# Patient Record
Sex: Female | Born: 1995 | Race: Black or African American | Hispanic: No | Marital: Single | Smoking: Never smoker
Health system: Southern US, Community
[De-identification: ages and names within clinical notes are randomized; demographics above are authoritative.]

## PROBLEM LIST (undated history)

## (undated) DIAGNOSIS — D649 Anemia, unspecified: Secondary | ICD-10-CM

## (undated) HISTORY — PX: NO PAST SURGERIES: SHX2092

---
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
## 2016-01-30 ENCOUNTER — Emergency Department (INDEPENDENT_AMBULATORY_CARE_PROVIDER_SITE_OTHER)
Admission: EM | Admit: 2016-01-30 | Discharge: 2016-01-30 | Disposition: A | Discharge status: Home / Self Care | Payer: Medicaid Other | Source: Home / Self Care | Attending: Family Medicine | Admitting: Family Medicine

## 2016-01-30 ENCOUNTER — Encounter (HOSPITAL_COMMUNITY): Payer: Self-pay | Admitting: Emergency Medicine

## 2016-01-30 DIAGNOSIS — K529 Noninfective gastroenteritis and colitis, unspecified: Secondary | ICD-10-CM | POA: Diagnosis not present

## 2016-01-30 LAB — POCT URINALYSIS DIP (DEVICE)
BILIRUBIN URINE: NEGATIVE
GLUCOSE, UA: NEGATIVE mg/dL
KETONES UR: NEGATIVE mg/dL
Nitrite: NEGATIVE
PROTEIN: NEGATIVE mg/dL
Urobilinogen, UA: 0.2 mg/dL (ref 0.0–1.0)
pH: 6 (ref 5.0–8.0)

## 2016-01-30 LAB — POCT PREGNANCY, URINE: PREG TEST UR: NEGATIVE

## 2016-01-30 MED ORDER — ONDANSETRON HCL 4 MG PO TABS: 4.0000 mg | ORAL_TABLET | Freq: Four times a day (QID) | ORAL | Status: DC

## 2016-01-30 NOTE — ED Provider Notes (Signed)
CSN: [id number]     Arrival date & time 01/30/16  1317 History   First MD Initiated Contact with Patient 01/30/16 1457     Chief Complaint  Patient presents with  . Abdominal Pain   (Consider location/radiation/quality/duration/timing/severity/associated sxs/prior Treatment) HPI History obtained from patient:   LOCATION:abdomen SEVERITY: DURATION:11 hours  CONTEXT:sudden onset 0400 of GI symptoms.  QUALITY: MODIFYING FACTORS: ASSOCIATED SYMPTOMS: TIMING:constant OCCUPATION:  History reviewed. No pertinent past medical history. History reviewed. No pertinent past surgical history. No family history on file. Social History  Substance Use Topics  . Smoking status: Never Smoker   . Smokeless tobacco: None  . Alcohol Use: No   OB History    No data available     Review of Systems ROS +'ve ABDOMINAL PAIN  Denies: HEADACHE, NAUSEA,  CHEST PAIN, CONGESTION, DYSURIA, SHORTNESS OF BREATH  Allergies  Review of patient's allergies indicates no known allergies.  Home Medications   Prior to Admission medications   Medication Sig Start Date End Date Taking? Authorizing Provider  ibuprofen (ADVIL,MOTRIN) 600 MG tablet Take 1 tablet (600 mg total) by mouth every 6 (six) hours as needed for pain. 05/12/13   Kristen Cardinal, NP   Meds Ordered and Administered this Visit  Medications - No data to display  BP 131/84 mmHg  Pulse 81  Temp(Src) 98.4 F (36.9 C) (Oral)  SpO2 100%  LMP 01/19/2016 No data found.   Physical Exam  Constitutional: She is oriented to person, place, and time. She appears well-developed and well-nourished. No distress.  HENT:  Head: Normocephalic and atraumatic.  Pulmonary/Chest: Effort normal and breath sounds normal.  Abdominal: Soft. Bowel sounds are normal. She exhibits no distension. There is no tenderness. There is no rebound and no guarding.  Musculoskeletal: Normal range of motion.  Neurological: She is alert and oriented to person, place, and  time.  Skin: Skin is warm and dry.  Psychiatric: She has a normal mood and affect. Her behavior is normal.    ED Course  Procedures (including critical care time)  Labs Review Labs Reviewed  POCT URINALYSIS DIP (DEVICE) - Abnormal; Notable for the following:    Hgb urine dipstick MODERATE (*)    Leukocytes, UA TRACE (*)    All other components within normal limits  POCT PREGNANCY, URINE    Imaging Review No results found.   Visual Acuity Review  Right Eye Distance:   Left Eye Distance:   Bilateral Distance:    Right Eye Near:   Left Eye Near:    Bilateral Near:         MDM   1. Gastroenteritis     Patient is advised to continue home symptomatic treatment. Prescription for zofran  sent pharmacy patient has indicated. Patient is advised that if there are new or worsening symptoms or attend the emergency department, or contact primary care provider. Instructions of care provided discharged home in stable condition. Return to work/school note provided.  THIS NOTE WAS GENERATED USING A VOICE RECOGNITION SOFTWARE PROGRAM. ALL REASONABLE EFFORTS  WERE MADE TO PROOFREAD THIS DOCUMENT FOR ACCURACY.     Konrad Felix, [place] 01/30/16 1525

## 2016-01-30 NOTE — Discharge Instructions (Signed)

## 2016-01-30 NOTE — ED Notes (Signed)
C/o abd pain onset 0400 today associated w/emesis and diarrhea Denies fever, chills, vag d/c, urinary sx A&O x4... No acute distress.

## 2016-08-11 ENCOUNTER — Encounter: Payer: Self-pay | Admitting: Obstetrics and Gynecology

## 2018-06-25 ENCOUNTER — Emergency Department (HOSPITAL_COMMUNITY)
Admission: EM | Admit: 2018-06-25 | Discharge: 2018-06-25 | Disposition: A | Discharge status: Home / Self Care | Payer: No Typology Code available for payment source | Attending: Emergency Medicine | Admitting: Emergency Medicine

## 2018-06-25 ENCOUNTER — Other Ambulatory Visit: Payer: Self-pay

## 2018-06-25 ENCOUNTER — Encounter (HOSPITAL_COMMUNITY): Payer: Self-pay | Admitting: Emergency Medicine

## 2018-06-25 DIAGNOSIS — Y999 Unspecified external cause status: Secondary | ICD-10-CM | POA: Diagnosis not present

## 2018-06-25 DIAGNOSIS — M542 Cervicalgia: Secondary | ICD-10-CM | POA: Insufficient documentation

## 2018-06-25 DIAGNOSIS — Y939 Activity, unspecified: Secondary | ICD-10-CM | POA: Diagnosis not present

## 2018-06-25 DIAGNOSIS — Y929 Unspecified place or not applicable: Secondary | ICD-10-CM | POA: Diagnosis not present

## 2018-06-25 DIAGNOSIS — Z79899 Other long term (current) drug therapy: Secondary | ICD-10-CM | POA: Insufficient documentation

## 2018-06-25 MED ORDER — CYCLOBENZAPRINE HCL 10 MG PO TABS: 10.0000 mg | ORAL_TABLET | Freq: Two times a day (BID) | ORAL | 0 refills | Status: DC | PRN

## 2018-06-25 MED ORDER — MELOXICAM 7.5 MG PO TABS: 7.5000 mg | ORAL_TABLET | Freq: Every day | ORAL | 0 refills | Status: DC
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
## 2018-12-27 NOTE — L&D Delivery Note (Signed)
Delivery Note  First Stage: Labor onset: 0200 Induction: Oxytocin Analgesia /Anesthesia intrapartum: IV meds and Epidural SROM at 0752 for meconium  Second Stage: Complete dilation at 0822 Onset of pushing at 0833 FHR second stage 135 bpm w/ moderate variability, recurrent variable  Delivery of a viable baby girl 12/23/19 at 0855 by Drinda Butts, CNM delivery of fetal head in OA position with restitution to LOA. Loose nuchal cord x 1;  Anterior then posterior shoulders delivered easily with gentle downward traction. Baby placed on mom's chest, and attended to by peds.  Cord double clamped after cessation of pulsation, cut by grandmother of baby.  Cord blood sample collected   Collection of cord blood donation: no Arterial cord blood sample: N/A  Third Stage: Placenta delivered schultz side intact with 3 VC @ 0859 Placenta disposition: discarded Uterine tone firm / bleeding mod  1st degree vaginal, periurethral, and hymenal  laceration identified  Anesthesia for repair: epidural Repair in usual fashion with 3-0 vicryl SH Est. Blood Loss (mL): [id number]  Complications: Trailing membranes noted with delivery of placenta.  Cervix inspected, no retained membranes noted.    Mom to postpartum.  Baby to Couplet care / Skin to Skin.  Newborn: Birth Weight:  5lbs 12oz Apgar Scores: 9/9 Feeding planned: Breast/formula

## 2019-06-22 ENCOUNTER — Other Ambulatory Visit (HOSPITAL_COMMUNITY): Payer: Self-pay | Admitting: Family Medicine

## 2019-06-22 ENCOUNTER — Other Ambulatory Visit: Payer: Self-pay | Admitting: Family Medicine

## 2019-06-22 LAB — URINALYSIS
Bilirubin, UA: NEGATIVE
Glucose, UA: NEGATIVE
Leukocytes,UA: NEGATIVE
Nitrite, UA: NEGATIVE
Specific Gravity, UA: 1.03 (ref 1.005–1.030)
Urobilinogen, Ur: 0.2 mg/dL (ref 0.2–1.0)
pH, UA: 6 (ref 5.0–7.5)

## 2019-06-22 LAB — HIV ANTIBODY (ROUTINE TESTING W REFLEX): HIV 1&2 Ab, 4th Generation: NONREACTIVE

## 2019-06-22 LAB — HEMOGLOBIN, FINGERSTICK: Hemoglobin: 12 g/dL (ref 11.1–15.9)

## 2019-06-22 LAB — OB RESULTS CONSOLE HEPATITIS B SURFACE ANTIGEN: Hepatitis B Surface Ag: NEGATIVE

## 2019-06-25 ENCOUNTER — Other Ambulatory Visit: Payer: Self-pay | Admitting: Physician Assistant

## 2019-06-25 DIAGNOSIS — Z369 Encounter for antenatal screening, unspecified: Secondary | ICD-10-CM

## 2019-06-26 LAB — CBC/D/PLT+RPR+RH+ABO+AB SCR
Antibody Screen: NEGATIVE
Basophils Absolute: 0 10*3/uL (ref 0.0–0.2)
Basos: 1 %
EOS (ABSOLUTE): 0.1 10*3/uL (ref 0.0–0.4)
Eos: 1 %
Hematocrit: 35.8 % (ref 34.0–46.6)
Hemoglobin: 12 g/dL (ref 11.1–15.9)
Hepatitis B Surface Ag: NEGATIVE
Immature Grans (Abs): 0 10*3/uL (ref 0.0–0.1)
Immature Granulocytes: 0 %
Lymphocytes Absolute: 1.2 10*3/uL (ref 0.7–3.1)
Lymphs: 14 %
MCH: 29.3 pg (ref 26.6–33.0)
MCHC: 33.5 g/dL (ref 31.5–35.7)
MCV: 87 fL (ref 79–97)
Monocytes Absolute: 0.6 10*3/uL (ref 0.1–0.9)
Monocytes: 7 %
Neutrophils Absolute: 6.8 10*3/uL (ref 1.4–7.0)
Neutrophils: 77 %
Platelets: 317 10*3/uL (ref 150–450)
RBC: 4.1 x10E6/uL (ref 3.77–5.28)
RDW: 12.6 % (ref 11.7–15.4)
RPR Ser Ql: NONREACTIVE
Rh Factor: POSITIVE
WBC: 8.8 10*3/uL (ref 3.4–10.8)

## 2019-06-26 LAB — URINE CULTURE

## 2019-06-26 LAB — HGB FRAC. W/SOLUBILITY
Hgb A2 Quant: 3 % (ref 1.8–3.2)
Hgb A: 97 % (ref 96.4–98.8)
Hgb C: 0 %
Hgb F Quant: 0 % (ref 0.0–2.0)
Hgb S: 0 %
Hgb Solubility: NEGATIVE
Hgb Variant: 0 %

## 2019-06-26 LAB — CHLAMYDIA/GC NAA, CONFIRMATION
Chlamydia trachomatis, NAA: NEGATIVE
Neisseria gonorrhoeae, NAA: NEGATIVE

## 2019-06-28 LAB — PAP IG (IMAGE GUIDED): PAP Smear Comment: 0

## 2019-07-02 ENCOUNTER — Other Ambulatory Visit: Payer: Self-pay

## 2019-07-02 ENCOUNTER — Ambulatory Visit (HOSPITAL_BASED_OUTPATIENT_CLINIC_OR_DEPARTMENT_OTHER)
Admission: RE | Admit: 2019-07-02 | Discharge: 2019-07-02 | Disposition: A | Discharge status: Home / Self Care | Payer: Medicaid Other | Source: Ambulatory Visit | Attending: Obstetrics and Gynecology | Admitting: Obstetrics and Gynecology

## 2019-07-02 ENCOUNTER — Ambulatory Visit
Admission: RE | Admit: 2019-07-02 | Discharge: 2019-07-02 | Disposition: A | Discharge status: Home / Self Care | Payer: Medicaid Other | Source: Ambulatory Visit | Attending: Obstetrics and Gynecology | Admitting: Obstetrics and Gynecology

## 2019-07-02 ENCOUNTER — Other Ambulatory Visit: Payer: Self-pay | Admitting: Obstetrics and Gynecology

## 2019-07-02 VITALS — BP 131/82 | HR 89 | Temp 98.2°F | Resp 20 | Ht 64.0 in | Wt 155.5 lb

## 2019-07-02 DIAGNOSIS — Z36 Encounter for antenatal screening for chromosomal anomalies: Secondary | ICD-10-CM

## 2019-07-02 DIAGNOSIS — D251 Intramural leiomyoma of uterus: Secondary | ICD-10-CM | POA: Diagnosis not present

## 2019-07-02 DIAGNOSIS — Z3A13 13 weeks gestation of pregnancy: Secondary | ICD-10-CM | POA: Diagnosis not present

## 2019-07-02 DIAGNOSIS — O3411 Maternal care for benign tumor of corpus uteri, first trimester: Secondary | ICD-10-CM | POA: Diagnosis not present

## 2019-07-02 DIAGNOSIS — Z369 Encounter for antenatal screening, unspecified: Secondary | ICD-10-CM

## 2019-07-02 NOTE — Progress Notes (Signed)
Virtual Visit via Telephone Note  I connected with Rennie Natter on July 02, 2019 at  8:30 AM EDT by telephone and verified that I am speaking with the correct person using two identifiers.  Lydia Gallagher was referred to [place] for genetic counseling to review prenatal screening and testing options.  This note summarizes the information we discussed.    We offered the following routine screening tests for this pregnancy:  The most accurate screening option for chromosome conditions is cell free fetal DNA testing.  Though this is typically reserved for pregnancies at increased risk for aneuploidy, it is currently being made available and many insurance companies are adding coverage for this testing in low risk patients during [place].  This test utilizes a maternal blood sample and DNA sequencing technology to isolate circulating cell free fetal DNA from maternal plasma.  The fetal DNA can then be analyzed for DNA sequences that are derived from the three most common chromosomes involved in aneuploidy, chromosomes 13, 18, and 21.  If the overall amount of DNA is greater than the expected level for any of these chromosomes, aneuploidy is suspected.  The detection rates are >99% for Down syndrome, >98% for trisomy 18 and >91% for Trisomy 13.  While we do not consider it a replacement for invasive testing and karyotype analysis, a negative result from this testing would be reassuring, though not a guarantee of a normal chromosome complement for the baby.  An abnormal result may be suggestive of an abnormal chromosome complement, though we would still recommend CVS or amniocentesis to confirm any findings from this testing.  First trimester screening, which includes nuchal translucency ultrasound screen and first trimester maternal serum marker screening, is the test that has most recently been available for low risk patients.  The nuchal translucency has approximately an 80%  detection rate for Down syndrome and can be positive for other chromosome abnormalities as well as congenital heart defects.  When combined with a maternal serum marker screening, the detection rate is up to 90% for Down syndrome and up to 97% for trisomy 18.   Given current recommendations during COVID, we are offering only the biochemical testing portion of this testing (without the ultrasound and NT portion), which has a much lower detection rate.  Maternal serum marker screening, or "quad" screen, is a blood test that measures pregnancy proteins, can provide risk assessments for Down syndrome, trisomy 18, and open neural tube defects (spina bifida, anencephaly). Because it does not directly examine the fetus, it cannot positively diagnose or rule out these problems. This is a second trimester option which could be offered along with the anatomy ultrasound. It can detect approximately 75% of babies with Down syndrome, 80% of babies with open spina bifida and 70% of babies with trisomy 65.  Targeted ultrasound uses high frequency sound waves to create an image of the developing fetus.  An ultrasound is often recommended as a routine means of evaluating the pregnancy.  It is also used to screen for fetal anatomy problems (for example, a heart defect) that might be suggestive of a chromosomal or other abnormality. We are currently not recommending a first trimester ultrasound other than that which would be ordered for dating and viability.  Should these screening tests indicate an increased concern, then the following additional testing options would be offered:  The chorionic villus sampling procedure is available for first trimester chromosome analysis.  This involves the withdrawal of a small amount  of chorionic villi (tissue from the developing placenta).  Risk of pregnancy loss is estimated to be approximately 1 in 200 to 1 in 100 (0.5 to 1%).  There is approximately a 1% (1 in 100) chance that the CVS  chromosome results will be unclear.  Chorionic villi cannot be tested for neural tube defects.     Amniocentesis involves the removal of a small amount of amniotic fluid from the sac surrounding the fetus with the use of a thin needle inserted through the maternal abdomen and uterus.  Ultrasound guidance is used throughout the procedure.  Fetal cells from amniotic fluid are directly evaluated and > 99.5% of chromosome problems and > 98% of open neural tube defects can be detected. This procedure is generally performed after the 15th week of pregnancy.  The main risks to this procedure include complications leading to miscarriage in less than 1 in 200 cases (0.5%).  Cystic Fibrosis and Spinal Muscular Atrophy (SMA) screening were also discussed with the patient. Both conditions are recessive, which means that both parents must be carriers in order to have a child with the disease.  Cystic fibrosis (CF) is one of the most common genetic conditions in persons of Caucasian ancestry.  This condition occurs in approximately 1 in 2,500 Caucasian persons and results in thickened secretions in the lungs, digestive, and reproductive systems.  For a baby to be at risk for having CF, both of the parents must be carriers for this condition.  Approximately 1 in 42 Caucasian persons is a carrier for CF.  Current carrier testing looks for the most common mutations in the gene for CF and can detect approximately 90% of carriers in the Caucasian population.  This means that the carrier screening can greatly reduce, but cannot eliminate, the chance for an individual to have a child with CF.  If an individual is found to be a carrier for CF, then carrier testing would be available for the partner. As part of [place] newborn screening profile, all babies born in the state of [place] will have a two-tier screening process.  Specimens are first tested to determine the concentration of immunoreactive trypsinogen (IRT).   The top 5% of specimens with the highest IRT values then undergo DNA testing using a panel of over 40 common CF mutations. SMA is a neurodegenerative disorder that leads to atrophy of skeletal muscle and overall weakness.  This condition is also more prevalent in the Caucasian population, with 1 in 40-1 in 60 persons being a carrier and 1 in 6,000-1 in 10,000 children being affected.  There are multiple forms of the disease, with some causing death in infancy to other forms with survival into adulthood.  The genetics of SMA is complex, but carrier screening can detect up to 95% of carriers in the Caucasian population.  Similar to CF, a negative result can greatly reduce, but cannot eliminate, the chance to have a child with SMA. The patient declined carrier screening for CF and SMA.  We talked about the option of signing up for Early Check to have the baby tested for SMA after delivery as part of a new study in [place].  This registration can be done online prior to delivery if desired. We also reviewed the option of hemoglobinopathy screening and her prior results of this testing.   We obtained a detailed family history and pregnancy history.  The family history was reported to be unremarkable for birth defects, intellectual delays, recurrent pregnancy loss or  known chromosome abnormalities.  This is the first pregnancy for Ms. Lund and her partner.  She reported no complications or exposures that would be expected to increase the risk for birth defects. Upon chart review, Ms. Ferraz had normal hemoglobinopathy screening (AA, MCV 87) drawn previously. She and her partner are both of African American ancestry.  After consideration of the options, Ms. Rumple elected to have [place] with SCA drawn at [facility].  This will be drawn following her dating ultrasound on 07/02/19 at 10am.  The patient declined carrier testing for CF and SMA.  The patient e was encouraged to call with questions or  concerns.  We can be contacted at [phone].  Labs ordered: MaterniT21 PLUS with SCA at [facility]  I provided 25 minutes of non-face-to-face time during this encounter.  Rock Nephew, Henreitta Leber, MS, CGC

## 2019-07-05 ENCOUNTER — Encounter: Payer: Self-pay | Admitting: Physician Assistant

## 2019-07-05 ENCOUNTER — Other Ambulatory Visit: Payer: Self-pay | Admitting: Physician Assistant

## 2019-07-05 DIAGNOSIS — D259 Leiomyoma of uterus, unspecified: Secondary | ICD-10-CM

## 2019-07-05 DIAGNOSIS — Z3400 Encounter for supervision of normal first pregnancy, unspecified trimester: Secondary | ICD-10-CM | POA: Insufficient documentation

## 2019-07-05 DIAGNOSIS — Z3401 Encounter for supervision of normal first pregnancy, first trimester: Secondary | ICD-10-CM

## 2019-07-05 DIAGNOSIS — N83209 Unspecified ovarian cyst, unspecified side: Secondary | ICD-10-CM | POA: Insufficient documentation

## 2019-07-07 LAB — MATERNIT21 PLUS CORE+SCA
Fetal Fraction: 6
Monosomy X (Turner Syndrome): NOT DETECTED
Result (T21): NEGATIVE
Trisomy 13 (Patau syndrome): NEGATIVE
Trisomy 18 (Edwards syndrome): NEGATIVE
Trisomy 21 (Down syndrome): NEGATIVE
XXX (Triple X Syndrome): NOT DETECTED
XXY (Klinefelter Syndrome): NOT DETECTED
XYY (Jacobs Syndrome): NOT DETECTED

## 2019-07-09 ENCOUNTER — Telehealth: Payer: Self-pay | Admitting: Obstetrics and Gynecology

## 2019-07-09 NOTE — Telephone Encounter (Signed)
The patient was informed of the results of her recent MaterniT21 testing which yielded NEGATIVE results.  The patient's specimen showed DNA consistent with two copies of chromosomes 21, 18 and 13.  The sensitivity for trisomy 96, trisomy 24 and trisomy 51 using this testing are reported as 99.1%, 99.9% and 91.7% respectively.  Thus, while the results of this testing are highly accurate, they are not considered diagnostic at this time.  Should more definitive information be desired, the patient may still consider amniocentesis.   As requested to know by the patient, sex chromosome analysis was included for this sample.  Results were not disclosed to the patient but her given by phone to her mother, Earlie Server, at the patient's request. The gender is predicted with >99% accuracy. Testing also showed no increased risk for sex chromosome abnormalities.  A maternal serum AFP only should be considered if screening for neural tube defects is desired.  We may be reached at [phone] with any questions or concerns.   Wilburt Finlay, MS, CGC

## 2019-07-19 ENCOUNTER — Ambulatory Visit: Payer: Self-pay

## 2019-07-27 ENCOUNTER — Other Ambulatory Visit: Payer: Self-pay

## 2019-07-27 ENCOUNTER — Ambulatory Visit: Payer: Medicaid Other | Admitting: Nurse Practitioner

## 2019-07-27 DIAGNOSIS — Z3402 Encounter for supervision of normal first pregnancy, second trimester: Secondary | ICD-10-CM

## 2019-07-27 MED ORDER — COMPLETENATE 29-1 MG PO CHEW: 1.0000 | CHEWABLE_TABLET | Freq: Every day | ORAL | Status: DC

## 2019-07-27 NOTE — Progress Notes (Signed)
PRENATAL VISIT NOTE  Subjective:  Lydia Gallagher is a 23 y.o. G1P0 at [redacted]w[redacted]d being seen today for ongoing prenatal care.  She is currently monitored for the following issues for this low-risk pregnancy and has Supervision of low-risk first pregnancy; Fibroid, uterine; and Simple ovarian cyst on their problem list.  Patient reports no complaints.   .  .   . Denies leaking of fluid/ROM.   The following portions of the patient's history were reviewed and updated as appropriate: allergies, current medications, past family history, past medical history, past social history, past surgical history and problem list. Problem list updated.  Objective:   Vitals:   07/27/19 1359  BP: 112/77  Temp: 98.4 F (36.9 C)  Weight: 155 lb (70.3 kg)    Fetal Status: Fetal Heart Rate (bpm): 150         General:  Alert, oriented and cooperative. Patient is in no acute distress.  Skin: Skin is warm and dry. No rash noted.   Cardiovascular: Normal heart rate noted  Respiratory: Normal respiratory effort, no problems with respiration noted  Abdomen: Soft, gravid, appropriate for gestational age.        Pelvic: Cervical exam deferred        Extremities: Normal range of motion.     Mental Status: Normal mood and affect. Normal behavior. Normal judgment and thought content.   Assessment and Plan:  Pregnancy: G1P0 at [redacted]w[redacted]d  1. Encounter for supervision of low-risk first pregnancy in second trimester Client doing well PNV prescription sent to pharmacy on file  FH - at umbilicus - prenatal vitamin w/FE, FA (NATACHEW) chewable tablet 1 tablet   Preterm labor symptoms and general obstetric precautions including but not limited to vaginal bleeding, contractions, leaking of fluid and fetal movement were reviewed in detail with the patient. Please refer to After Visit Summary for other counseling recommendations.  Return in about 4 weeks (around 08/24/2019) for telehealth, routine prenatal care.  Future  Appointments  Date Time Provider [place]  08/13/2019 10:00 AM [facility] [place] [facility]  08/14/2019  9:10 AM [facility] None  08/24/2019 10:40 AM [facility] PROVIDER [facility] None    Berniece Andreas, NP

## 2019-07-27 NOTE — Progress Notes (Signed)
In for visit; aware of u/s: 08/13/19; discussed AFP only screening today-declines; needs PNV Rx sent to pharmacy Debera Lat, RN

## 2019-08-09 ENCOUNTER — Other Ambulatory Visit: Payer: Self-pay

## 2019-08-09 DIAGNOSIS — Z3402 Encounter for supervision of normal first pregnancy, second trimester: Secondary | ICD-10-CM

## 2019-08-13 ENCOUNTER — Other Ambulatory Visit: Payer: Self-pay

## 2019-08-13 ENCOUNTER — Ambulatory Visit
Admission: RE | Admit: 2019-08-13 | Discharge: 2019-08-13 | Disposition: A | Discharge status: Home / Self Care | Payer: Medicaid Other | Source: Ambulatory Visit | Attending: Maternal & Fetal Medicine | Admitting: Maternal & Fetal Medicine

## 2019-08-13 DIAGNOSIS — O3412 Maternal care for benign tumor of corpus uteri, second trimester: Secondary | ICD-10-CM | POA: Insufficient documentation

## 2019-08-13 DIAGNOSIS — D259 Leiomyoma of uterus, unspecified: Secondary | ICD-10-CM | POA: Diagnosis not present

## 2019-08-13 DIAGNOSIS — Z3402 Encounter for supervision of normal first pregnancy, second trimester: Secondary | ICD-10-CM

## 2019-08-13 DIAGNOSIS — Z3A19 19 weeks gestation of pregnancy: Secondary | ICD-10-CM | POA: Insufficient documentation

## 2019-08-14 ENCOUNTER — Ambulatory Visit: Payer: Medicaid Other

## 2019-08-16 ENCOUNTER — Other Ambulatory Visit: Payer: Self-pay | Admitting: Physician Assistant

## 2019-08-16 ENCOUNTER — Telehealth: Payer: Self-pay

## 2019-08-16 ENCOUNTER — Telehealth: Payer: Self-pay | Admitting: Physician Assistant

## 2019-08-16 DIAGNOSIS — Z3492 Encounter for supervision of normal pregnancy, unspecified, second trimester: Secondary | ICD-10-CM

## 2019-08-16 MED ORDER — COMPLETENATE 29-1 MG PO CHEW: 1.0000 | CHEWABLE_TABLET | Freq: Every day | ORAL | 3 refills | Status: DC

## 2019-08-16 NOTE — Telephone Encounter (Signed)
Per client needs PNV. As has Medicaid, A. Streilein PA-C notifed and will e-prescribe to clients pharmacy of choice in EHR. Client aware. Shona Needles, RN

## 2019-08-16 NOTE — Telephone Encounter (Signed)
eRx chewable PNV

## 2019-08-24 ENCOUNTER — Telehealth: Payer: Self-pay

## 2019-08-24 ENCOUNTER — Ambulatory Visit: Payer: Medicaid Other

## 2019-08-24 NOTE — Telephone Encounter (Signed)
2nd attempt to reach for Telehealth visit; no answer, left another voicemail message with appt# to call Debera Lat, RN

## 2019-08-24 NOTE — Telephone Encounter (Signed)
Attempted call for [facility] Telehealth visit; no answer, left voicemail message Debera Lat, RN

## 2019-08-28 ENCOUNTER — Telehealth: Payer: Self-pay

## 2019-08-28 NOTE — Telephone Encounter (Signed)
Per [facility]-she reached client and she will be "out of town" until 09/2019; rescheduled Telehealth visit 08/30/19. Debera Lat , RN.

## 2019-08-28 NOTE — Telephone Encounter (Signed)
Phone call to pt (3rd attempt). Left message that RN with [facility] is trying to get in touch with her for a telehealth visit. [facility] number is [id number].

## 2019-08-30 ENCOUNTER — Other Ambulatory Visit: Payer: Self-pay

## 2019-08-30 ENCOUNTER — Encounter: Payer: Self-pay | Admitting: Physician Assistant

## 2019-08-30 ENCOUNTER — Ambulatory Visit: Payer: Medicaid Other | Admitting: Physician Assistant

## 2019-08-30 DIAGNOSIS — D259 Leiomyoma of uterus, unspecified: Secondary | ICD-10-CM | POA: Insufficient documentation

## 2019-08-30 DIAGNOSIS — Z3402 Encounter for supervision of normal first pregnancy, second trimester: Secondary | ICD-10-CM

## 2019-08-30 NOTE — Progress Notes (Signed)
Call for [place] visit; is out of town until after 10/06/19; confirmed identity and agrees to visit; aware of u/s: 10/15/19 Debera Lat, RN

## 2019-08-30 NOTE — Progress Notes (Signed)
   TELEPHONE OBSTETRICS VISIT ENCOUNTER NOTE  I connected with@ on 08/30/19 at 11:00 AM EDT by telephone in [facility] where she is visiting the family of the father of her baby and verified that I am speaking with the correct person using two identifiers.   I discussed the limitations, risks, security and privacy concerns of performing an evaluation and management service by telephone and the availability of in person appointments. I also discussed with the patient that there may be a patient responsible charge related to this service. The patient expressed understanding and agreed to proceed.  Subjective:  Lydia Gallagher is a 23 y.o. G1P0 at [redacted]w[redacted]d being followed for ongoing prenatal care.  She is currently monitored for the following issues for this low-risk pregnancy and has Supervision of low-risk first pregnancy; Fibroid, uterine; Simple ovarian cyst; and Uterine fibroid complicating antenatal care, baby not yet delivered on their problem list.  Patient reports no complaints. Reports fetal movement. Denies any contractions, bleeding or leaking of fluid.   The following portions of the patient's history were reviewed and updated as appropriate: allergies, current medications, past family history, past medical history, past social history, past surgical history and problem list.   Objective:   General:  Alert, oriented and cooperative.   Mental Status: Normal mood and affect perceived. Normal judgment and thought content.  Rest of physical exam deferred due to type of encounter  Assessment and Plan:  Pregnancy: G1P0 at [redacted]w[redacted]d 1. Encounter for supervision of low-risk first pregnancy in second trimester Feels well, no concerns. Will be in [place] until 10/06/19, so deferred RV slightly. Enc po hydration in light of hot weather.   2. Uterine fibroid complicating antenatal care, baby not yet delivered Reviewed fibroid info with pt who agrees to keep f/u fetal growth [place] as sched (she notes [facility]  changed appt from 10/15/19 to 10/08/19.) Has not needed to take short term ibuprofen, as not having fibroid degradation pain.  Preterm labor symptoms and general obstetric precautions including but not limited to vaginal bleeding, contractions, leaking of fluid and fetal movement were reviewed in detail with the patient.  I discussed the assessment and treatment plan with the patient. The patient was provided an opportunity to ask questions and all were answered. The patient agreed with the plan and demonstrated an understanding of the instructions. The patient was advised to call back or seek an in-person office evaluation/go to the hospital for any urgent or concerning symptoms.  Please refer to After Visit Summary for other counseling recommendations.   I provided 6 minutes of non-face-to-face time during this encounter.  Return in about 4 weeks (around 09/27/2019) for Routine prenatal care.  Future Appointments  Date Time Provider [place]  09/11/2019  9:40 AM [facility] PROVIDER [facility] None  10/08/2019  8:00 AM [facility] [place] [facility]  10/08/2019  1:20 PM [facility] PROVIDER [facility] None    Lora Havens, PA-C

## 2019-09-11 ENCOUNTER — Ambulatory Visit: Payer: Medicaid Other

## 2019-09-11 ENCOUNTER — Telehealth: Payer: Self-pay

## 2019-09-11 NOTE — Telephone Encounter (Signed)
[facility] in [facility]) 9/152020 am. Phone call to client and [facility] and reschedule appt. Number to call provided. Rich Number, RN

## 2019-10-02 ENCOUNTER — Encounter: Payer: Self-pay | Admitting: Family Medicine

## 2019-10-04 ENCOUNTER — Other Ambulatory Visit: Payer: Self-pay

## 2019-10-04 DIAGNOSIS — D259 Leiomyoma of uterus, unspecified: Secondary | ICD-10-CM

## 2019-10-08 ENCOUNTER — Ambulatory Visit: Payer: Self-pay

## 2019-10-08 ENCOUNTER — Ambulatory Visit
Admission: RE | Admit: 2019-10-08 | Discharge: 2019-10-08 | Disposition: A | Discharge status: Home / Self Care | Payer: Medicaid Other | Source: Ambulatory Visit | Attending: Obstetrics and Gynecology | Admitting: Obstetrics and Gynecology

## 2019-10-08 ENCOUNTER — Other Ambulatory Visit: Payer: Self-pay

## 2019-10-08 ENCOUNTER — Ambulatory Visit: Payer: Medicaid Other | Admitting: Family Medicine

## 2019-10-08 ENCOUNTER — Other Ambulatory Visit: Payer: Self-pay | Admitting: Family Medicine

## 2019-10-08 VITALS — BP 106/72 | Temp 98.2°F | Wt 166.2 lb

## 2019-10-08 DIAGNOSIS — O3410 Maternal care for benign tumor of corpus uteri, unspecified trimester: Secondary | ICD-10-CM

## 2019-10-08 DIAGNOSIS — D259 Leiomyoma of uterus, unspecified: Secondary | ICD-10-CM

## 2019-10-08 DIAGNOSIS — Z23 Encounter for immunization: Secondary | ICD-10-CM | POA: Diagnosis not present

## 2019-10-08 DIAGNOSIS — Z3403 Encounter for supervision of normal first pregnancy, third trimester: Secondary | ICD-10-CM

## 2019-10-08 DIAGNOSIS — O99013 Anemia complicating pregnancy, third trimester: Secondary | ICD-10-CM

## 2019-10-08 LAB — HEMOGLOBIN, FINGERSTICK: Hemoglobin: 10.1 g/dL — ABNORMAL LOW (ref 11.1–15.9)

## 2019-10-08 LAB — HIV ANTIBODY (ROUTINE TESTING W REFLEX): HIV Screen 4th Generation wRfx: NONREACTIVE

## 2019-10-08 MED ORDER — PREPLUS 27-1 MG PO TABS: 1.0000 | ORAL_TABLET | Freq: Every day | ORAL | 13 refills | Status: DC

## 2019-10-08 MED ORDER — PRENATA 29-1 MG PO CHEW: 1.0000 | CHEWABLE_TABLET | Freq: Every day | ORAL | 13 refills | Status: DC

## 2019-10-08 NOTE — Progress Notes (Signed)
Updated PNV information in chart.

## 2019-10-08 NOTE — Progress Notes (Addendum)
Here today for 27.4 week MH RV. Taking PNV QD, denies ED/hospital visits since last appt. 28 week labs and Tdap today. Declines Flu vaccine. Tdap given and tolerated well. Requests to change delivering providers from [facility] to [facility]. Hal Morales, RN Hgb 10.1. Iron given per standing order. Anemia Panel added to labs today. Hal Morales, RN

## 2019-10-08 NOTE — Progress Notes (Signed)
PRENATAL VISIT NOTE  Subjective:  Lydia Gallagher is a 23 y.o. G1P0 at [redacted]w[redacted]d being seen today for ongoing prenatal care.  She is currently monitored for the following issues for this low-risk pregnancy and has Supervision of low-risk first pregnancy; Fibroid, uterine; Simple ovarian cyst; and Uterine fibroid complicating antenatal care, baby not yet delivered on their problem list.  Patient reports leg cramps  Contractions: Not present. Vag. Bleeding: None.  Movement: Present. Denies leaking of fluid/ROM.   The following portions of the patient's history were reviewed and updated as appropriate: allergies, current medications, past family history, past medical history, past social history, past surgical history and problem list. Problem list updated.  Objective:   Vitals:   10/08/19 1314  BP: 106/72  Temp: 98.2 F (36.8 C)  Weight: 166 lb 3.2 oz (75.4 kg)    Fetal Status: Fetal Heart Rate (bpm): 145 Fundal Height: 28 cm Movement: Present     General:  Alert, oriented and cooperative. Patient is in no acute distress.  Skin: Skin is warm and dry. No rash noted.   Cardiovascular: Normal heart rate noted  Respiratory: Normal respiratory effort, no problems with respiration noted  Abdomen: Soft, gravid, appropriate for gestational age.  Pain/Pressure: Absent     Pelvic: Cervical exam deferred        Extremities: Normal range of motion.  Edema: None  Mental Status: Normal mood and affect. Normal behavior. Normal judgment and thought content.   Assessment and Plan:  Pregnancy: G1P0 at [redacted]w[redacted]d  1. Encounter for supervision of low-risk first pregnancy in third trimester UP to date Had [place] today- showed consistent growth and fibroid at the same side.  - Hemoglobin, venipuncture - Glucose, 1 hour gestational - HIV [place] [facility] - [facility]PR - Tdap vaccine greater than or equal to 7yo IM - Prenatal Vit-Fe Fumarate-FA (PREPLUS) 27-1 MG TABS; Take 1 tablet by mouth daily.  Dispense: 30 tablet;  Refill: 13  2. Need for diphtheria-tetanus-pertussis (Tdap) vaccine - Tdap vaccine greater than or equal to 7yo IM  3. Uterine fibroid complicating antenatal care, baby not yet delivered Stable size of fibroid on [place] 10/12   Preterm labor symptoms and general obstetric precautions including but not limited to vaginal bleeding, contractions, leaking of fluid and fetal movement were reviewed in detail with the patient. Please refer to After Visit Summary for other counseling recommendations.   Return in about 2 weeks (around 10/22/2019) for Routine prenatal care, Telehealth/Virtual health OB Visit.  Future Appointments  Date Time Provider [place]  10/22/2019  1:20 PM [facility] PROVIDER [facility] None    Caren Macadam, MD

## 2019-10-09 ENCOUNTER — Ambulatory Visit: Payer: Medicaid Other

## 2019-10-09 LAB — FE+CBC/D/PLT+TIBC+FER+RETIC
Basophils Absolute: 0.1 10*3/uL (ref 0.0–0.2)
Basos: 1 %
EOS (ABSOLUTE): 0.5 10*3/uL — ABNORMAL HIGH (ref 0.0–0.4)
Eos: 4 %
Ferritin: 22 ng/mL (ref 15–150)
Hematocrit: 29.4 % — ABNORMAL LOW (ref 34.0–46.6)
Hemoglobin: 10.1 g/dL — ABNORMAL LOW (ref 11.1–15.9)
Immature Grans (Abs): 0.1 10*3/uL (ref 0.0–0.1)
Immature Granulocytes: 1 %
Iron Saturation: 10 % — ABNORMAL LOW (ref 15–55)
Iron: 37 ug/dL (ref 27–159)
Lymphocytes Absolute: 1.3 10*3/uL (ref 0.7–3.1)
Lymphs: 10 %
MCH: 29 pg (ref 26.6–33.0)
MCHC: 34.4 g/dL (ref 31.5–35.7)
MCV: 85 fL (ref 79–97)
Monocytes Absolute: 1.1 10*3/uL — ABNORMAL HIGH (ref 0.1–0.9)
Monocytes: 9 %
Neutrophils Absolute: 9.6 10*3/uL — ABNORMAL HIGH (ref 1.4–7.0)
Neutrophils: 75 %
Platelets: 300 10*3/uL (ref 150–450)
RBC: 3.48 x10E6/uL — ABNORMAL LOW (ref 3.77–5.28)
RDW: 12.6 % (ref 11.7–15.4)
Retic Ct Pct: 1.9 % (ref 0.6–2.6)
Total Iron Binding Capacity: 362 ug/dL (ref 250–450)
UIBC: 325 ug/dL (ref 131–425)
WBC: 12.7 10*3/uL — ABNORMAL HIGH (ref 3.4–10.8)

## 2019-10-09 LAB — RPR: RPR Ser Ql: NONREACTIVE

## 2019-10-09 LAB — GLUCOSE, 1 HOUR GESTATIONAL: Gestational Diabetes Screen: 93 mg/dL (ref 65–139)

## 2019-10-10 ENCOUNTER — Observation Stay: Payer: Medicaid Other

## 2019-10-10 ENCOUNTER — Other Ambulatory Visit: Payer: Self-pay

## 2019-10-10 ENCOUNTER — Telehealth: Payer: Self-pay

## 2019-10-10 ENCOUNTER — Observation Stay
Admission: EM | Admit: 2019-10-10 | Discharge: 2019-10-10 | Disposition: A | Discharge status: Home / Self Care | Payer: Medicaid Other | Attending: Obstetrics & Gynecology | Admitting: Obstetrics & Gynecology

## 2019-10-10 DIAGNOSIS — O3412 Maternal care for benign tumor of corpus uteri, second trimester: Secondary | ICD-10-CM | POA: Insufficient documentation

## 2019-10-10 DIAGNOSIS — M549 Dorsalgia, unspecified: Secondary | ICD-10-CM | POA: Diagnosis present

## 2019-10-10 DIAGNOSIS — Z3A27 27 weeks gestation of pregnancy: Secondary | ICD-10-CM | POA: Insufficient documentation

## 2019-10-10 DIAGNOSIS — Z3403 Encounter for supervision of normal first pregnancy, third trimester: Secondary | ICD-10-CM

## 2019-10-10 DIAGNOSIS — O26832 Pregnancy related renal disease, second trimester: Secondary | ICD-10-CM | POA: Diagnosis not present

## 2019-10-10 DIAGNOSIS — O99891 Other specified diseases and conditions complicating pregnancy: Secondary | ICD-10-CM | POA: Diagnosis present

## 2019-10-10 DIAGNOSIS — N1330 Unspecified hydronephrosis: Secondary | ICD-10-CM | POA: Insufficient documentation

## 2019-10-10 HISTORY — DX: Anemia, unspecified: D64.9

## 2019-10-10 LAB — URINALYSIS, ROUTINE W REFLEX MICROSCOPIC
Bacteria, UA: NONE SEEN
Bilirubin Urine: NEGATIVE
Glucose, UA: NEGATIVE mg/dL
Ketones, ur: 20 mg/dL — AB
Nitrite: NEGATIVE
Protein, ur: NEGATIVE mg/dL
Specific Gravity, Urine: 1.012 (ref 1.005–1.030)
pH: 6 (ref 5.0–8.0)

## 2019-10-10 LAB — CBC WITH DIFFERENTIAL/PLATELET
Abs Immature Granulocytes: 0.11 10*3/uL — ABNORMAL HIGH (ref 0.00–0.07)
Basophils Absolute: 0 10*3/uL (ref 0.0–0.1)
Basophils Relative: 0 %
Eosinophils Absolute: 0.2 10*3/uL (ref 0.0–0.5)
Eosinophils Relative: 1 %
HCT: 29 % — ABNORMAL LOW (ref 36.0–46.0)
Hemoglobin: 9.7 g/dL — ABNORMAL LOW (ref 12.0–15.0)
Immature Granulocytes: 1 %
Lymphocytes Relative: 7 %
Lymphs Abs: 1.2 10*3/uL (ref 0.7–4.0)
MCH: 28.9 pg (ref 26.0–34.0)
MCHC: 33.4 g/dL (ref 30.0–36.0)
MCV: 86.3 fL (ref 80.0–100.0)
Monocytes Absolute: 1.1 10*3/uL — ABNORMAL HIGH (ref 0.1–1.0)
Monocytes Relative: 7 %
Neutro Abs: 13.3 10*3/uL — ABNORMAL HIGH (ref 1.7–7.7)
Neutrophils Relative %: 84 %
Platelets: 291 10*3/uL (ref 150–400)
RBC: 3.36 MIL/uL — ABNORMAL LOW (ref 3.87–5.11)
RDW: 12.5 % (ref 11.5–15.5)
WBC: 15.9 10*3/uL — ABNORMAL HIGH (ref 4.0–10.5)
nRBC: 0 % (ref 0.0–0.2)

## 2019-10-10 LAB — BASIC METABOLIC PANEL
Anion gap: 11 (ref 5–15)
BUN: 6 mg/dL (ref 6–20)
CO2: 22 mmol/L (ref 22–32)
Calcium: 8.8 mg/dL — ABNORMAL LOW (ref 8.9–10.3)
Chloride: 104 mmol/L (ref 98–111)
Creatinine, Ser: 0.75 mg/dL (ref 0.44–1.00)
GFR calc Af Amer: >60 mL/min (ref >60)
GFR calc non Af Amer: >60 mL/min (ref >60)
Glucose, Bld: 99 mg/dL (ref 70–99)
Potassium: 3.3 mmol/L — ABNORMAL LOW (ref 3.5–5.1)
Sodium: 137 mmol/L (ref 135–145)

## 2019-10-10 MED ORDER — OXYCODONE-ACETAMINOPHEN 5-325 MG PO TABS: 1.0000 | ORAL_TABLET | ORAL | 0 refills | Status: DC | PRN

## 2019-10-10 MED ORDER — SULFAMETHOXAZOLE-TRIMETHOPRIM 800-160 MG PO TABS
1.0000 | ORAL_TABLET | Freq: Two times a day (BID) | ORAL | Status: DC
  Filled 2019-10-10: qty 1

## 2019-10-10 MED ORDER — OXYCODONE-ACETAMINOPHEN 5-325 MG PO TABS
1.0000 | ORAL_TABLET | Freq: Once | ORAL | Status: AC
  Administered 2019-10-10: 1 via ORAL
  Filled 2019-10-10: qty 1

## 2019-10-10 MED ORDER — CYCLOBENZAPRINE HCL 5 MG PO TABS
5.0000 mg | ORAL_TABLET | Freq: Once | ORAL | Status: AC
  Administered 2019-10-10: 5 mg via ORAL
  Filled 2019-10-10: qty 0.5
  Filled 2019-10-10: qty 1

## 2019-10-10 MED ORDER — SULFAMETHOXAZOLE-TRIMETHOPRIM 800-160 MG PO TABS: 1.0000 | ORAL_TABLET | Freq: Two times a day (BID) | ORAL | 0 refills | Status: DC

## 2019-10-10 NOTE — OB Triage Note (Signed)
Pt presents to triage with complaint Right-side pain that radiates to her lower back. It is a constant, "pressure/sharp" pain that she rates 8/10. It yesterday morning and was unrelieved by tylenol or heat. The pt states it worsens with movement and certain positions. Pt. Also states she tried eating but vomited yesterday. No urinary symptoms. She was treated with full course of abx for a UTI a few weeks prior.  No bleeding or LOF. VSS. Will continue to monitor.

## 2019-10-10 NOTE — Telephone Encounter (Signed)
TC from patient stating she is having right side abdominal pain since yesterday. She states she has also been unable to keep food down since yesterday morning and throws up every time she eats/drinks. She states the pain is so bad she can hardly move and everything she does makes it hurt more. Per Ola Spurr, patient counseled to go to the ED today as soon as she can to be evaluated. Patient agrees to plan.Jenetta Downer, RN

## 2019-10-10 NOTE — Discharge Instructions (Signed)
Your kidney on the right is slightly enlarged due to the compression on the tube between your kidney and your bladder.  This is due to the enlargement of the uterus.  You will be referred to Urology for future evaluation and possible treatment of this condition - called Hydronephrosis.  It is common in pregnancy.  It is possible you also have a bladder/kidney infection, so for now you will be treated as though you do.  When your urine culture returns, we will determine if you need to continue the antibiotics that are ordered for you.  Please take these twice a day for 7 days.  You have been given a narcotic for pain control.  The hope is that this situation is either temporary or that it will resolve with intervention.  This medication only tricks your brain into thinking it is not in pain.  Please take these with caution and with care.  They can become addicting, however we use them in pregnancy when necessary since other medications are not recommended.  Follow up as scheduled for your next prenatal visit. Please call to schedule an appointment with Urology in the next few days.

## 2019-10-10 NOTE — Discharge Summary (Addendum)
Back and side - Right, worse with walking movement  havent been able to eat Cough it hurts Vomiting yesterday No diarrhea or contstipation No fever chills  ~~*~~*~~*~~*~~*~~*~~*~~*~~*~~*~~*~~*   Lydia Gallagher is a 23 y.o. female. She is at 110w6d gestation. Patient's last menstrual period was 03/10/2019. Estimated Date of Delivery: 01/03/20  Prenatal care site: [facility], recently in [place].  Chief complaint: right flank/back pain  Location: right flank/back Onset/timing: sudden onset yesterday Duration: 24hrs Quality:  Sharp stabbing Severity: 8/10 Aggravating or alleviating conditions: nothing makes better, worse with walking, coughingand movement Associated signs/symptoms: + nausea and vomiting, no dysuria, hematuria, no diarrhea/constipation Context: patient was treated in [place] for UTI 3 weeks ago with Macrobid 100mg  BID x 7 days - patient completed treatment, and did not have symptoms.  She does not know if urine culture was sent or returned.  Yesterday she developed significant R flank and mid back pain, with no radiation.  No uterine tenderness.  She has known fibroid uterus.    S: Resting comfortably. no CTX, no VB.no LOF,  Active fetal movement. Having non-stressed conversation with support person and with me.  Maternal Medical History:   Past Medical History:  Diagnosis Date  . Anemia     Past Surgical History:  Procedure Laterality Date  . NO PAST SURGERIES      No Known Allergies  Prior to Admission medications   Medication Sig Start Date End Date Taking? Authorizing Provider  acetaminophen (TYLENOL) 500 MG tablet Take 500 mg by mouth every 6 (six) hours as needed for headache.   Yes [provider]  Prenatal Vit-Fe Fumarate-FA (PREPLUS) 27-1 MG TABS Take 1 tablet by mouth daily. 10/08/19  Yes Caren Macadam, MD     Social History: She  reports that she has never smoked. She has never used smokeless tobacco. She reports that she does not  drink alcohol or use drugs.  Family History: no history of gyn cancers  Review of Systems: A full review of systems was performed and negative except as noted in the HPI.     O:  BP 113/60 (BP Location: Left Arm)   Pulse (!) 103   Temp 97.9 F (36.6 C) (Oral)   Resp 16   Ht 5\' 4"  (1.626 m)   Wt 75.3 kg   LMP 03/10/2019   BMI 28.49 kg/m  Results for orders placed or performed during the hospital encounter of 10/10/19 (from the past 48 hour(s))  Urinalysis, Routine w reflex microscopic   Collection Time: 10/10/19 10:13 AM  Result Value Ref Range   Color, Urine YELLOW (A) YELLOW   APPearance HAZY (A) CLEAR   Specific Gravity, Urine 1.012 1.005 - 1.030   pH 6.0 5.0 - 8.0   Glucose, UA NEGATIVE NEGATIVE mg/dL   Hgb urine dipstick SMALL (A) NEGATIVE   Bilirubin Urine NEGATIVE NEGATIVE   Ketones, ur 20 (A) NEGATIVE mg/dL   Protein, ur NEGATIVE NEGATIVE mg/dL   Nitrite NEGATIVE NEGATIVE   Leukocytes,Ua LARGE (A) NEGATIVE   RBC / HPF 0-5 0 - 5 RBC/hpf   WBC, UA 11-20 0 - 5 WBC/hpf   Bacteria, UA NONE SEEN NONE SEEN   Squamous Epithelial / LPF 11-20 0 - 5   Mucus PRESENT   CBC with Differential/Platelet   Collection Time: 10/10/19 12:43 PM  Result Value Ref Range   WBC 15.9 (H) 4.0 - 10.5 K/uL   RBC 3.36 (L) 3.87 - 5.11 MIL/uL   Hemoglobin 9.7 (L) 12.0 -  15.0 g/dL   HCT 29.0 (L) 36.0 - 46.0 %   MCV 86.3 80.0 - 100.0 fL   MCH 28.9 26.0 - 34.0 pg   MCHC 33.4 30.0 - 36.0 g/dL   RDW 12.5 11.5 - 15.5 %   Platelets 291 150 - 400 K/uL   nRBC 0.0 0.0 - 0.2 %   Neutrophils Relative % 84 %   Neutro Abs 13.3 (H) 1.7 - 7.7 K/uL   Lymphocytes Relative 7 %   Lymphs Abs 1.2 0.7 - 4.0 K/uL   Monocytes Relative 7 %   Monocytes Absolute 1.1 (H) 0.1 - 1.0 K/uL   Eosinophils Relative 1 %   Eosinophils Absolute 0.2 0.0 - 0.5 K/uL   Basophils Relative 0 %   Basophils Absolute 0.0 0.0 - 0.1 K/uL   Immature Granulocytes 1 %   Abs Immature Granulocytes 0.11 (H) 0.00 - 0.07 K/uL  Basic  metabolic panel   Collection Time: 10/10/19 12:43 PM  Result Value Ref Range   Sodium 137 135 - 145 mmol/L   Potassium 3.3 (L) 3.5 - 5.1 mmol/L   Chloride 104 98 - 111 mmol/L   CO2 22 22 - 32 mmol/L   Glucose, Bld 99 70 - 99 mg/dL   BUN 6 6 - 20 mg/dL   Creatinine, Ser 0.75 0.44 - 1.00 mg/dL   Calcium 8.8 (L) 8.9 - 10.3 mg/dL   GFR calc non Af Amer >60 >60 mL/min   GFR calc Af Amer >60 >60 mL/min   Anion gap 11 5 - 15     Constitutional: NAD, AAOx3  HE/ENT: extraocular movements grossly intact, moist mucous membranes CV: RRR PULM: nl respiratory effort, CTABL     Abd: gravid, non-tender, non-distended, soft      Ext: Non-tender, Nonedmeatous   Psych: mood appropriate, speech normal Pelvic deferred  NST:  Baseline: 140 Variability: moderate Accelerations present x >2 Decelerations absent Time 71mins  Mr Pelvis Wo Contrast  Result Date: 10/10/2019 CLINICAL DATA:  23 year old pregnant female with right sided pain radiating to the low back for 1 day. EXAM: MRI ABDOMEN AND PELVIS WITHOUT CONTRAST TECHNIQUE: Multiplanar multisequence MR imaging of the abdomen and pelvis was performed. No intravenous contrast was administered. COMPARISON:  None. FINDINGS: COMBINED FINDINGS FOR BOTH MR ABDOMEN AND PELVIS Lower chest: No acute abnormality at the lung bases. Hepatobiliary: Normal liver size and configuration. No liver mass. Normal gallbladder with no cholelithiasis. No biliary ductal dilatation. Common bile duct diameter 3 mm. No evidence of choledocholithiasis. Pancreas: No pancreatic mass or duct dilation.  No pancreas divisum. Spleen: Normal size. No mass. Adrenals/Urinary Tract: Normal adrenals. There is asymmetric mild to moderate right hydroureteronephrosis to the level of upper pelvic right ureter, with extrinsic mass effect on upper right pelvic ureter by the dominant posterior uterine fibroid. There is a small to moderate amount of simple right perinephric fluid. No left  hydronephrosis. No left perinephric fluid. No renal masses. Normal bladder. Stomach/Bowel: Normal non-distended stomach. Normal caliber small and large bowel. No bowel wall thickening. No mesenteric or pericolonic edema. Candidate normal appendix in the right lower quadrant (series 4/image 24). No pericecal inflammatory changes. Moderate transverse colonic gas. Vascular/Lymphatic: Normal caliber abdominal aorta. No pathologically enlarged lymph nodes in the abdomen. Reproductive: Enlarged gravid uterus with single intrauterine gestation in cephalic lie. The scan is not tailored for fetal evaluation. Amniotic fluid volume is subjectively normal. Posterior placenta without previa. Cervix length approximately 3.2 cm without evidence of internal cervical funneling. There are multiple (  at least 4) uterine fibroids, largest 5.5 x 5.4 x 6.8 cm in posterior right uterine body (series 11/image 56). Normal ovaries bilaterally. No adnexal masses. Other: No abdominal ascites or focal fluid collection. Musculoskeletal: No aggressive appearing focal osseous lesions. IMPRESSION: 1. Mild to moderate asymmetric right hydroureteronephrosis to the level of the upper pelvic right ureter. Asymmetric right perinephric simple fluid. Extrinsic mass-effect on the right ureter by the dominant 6.8 cm posterior right uterine body fibroid. Suspect right ureteral obstruction by the dominant right posterior uterine fibroid. 2. No evidence of acute appendicitis. Electronically Signed   By: Ilona Sorrel M.D.   On: 10/10/2019 18:24   Mr Abdomen Wo Contrast  Result Date: 10/10/2019 CLINICAL DATA:  23 year old pregnant female with right sided pain radiating to the low back for 1 day. EXAM: MRI ABDOMEN AND PELVIS WITHOUT CONTRAST TECHNIQUE: Multiplanar multisequence MR imaging of the abdomen and pelvis was performed. No intravenous contrast was administered. COMPARISON:  None. FINDINGS: COMBINED FINDINGS FOR BOTH MR ABDOMEN AND PELVIS Lower chest:  No acute abnormality at the lung bases. Hepatobiliary: Normal liver size and configuration. No liver mass. Normal gallbladder with no cholelithiasis. No biliary ductal dilatation. Common bile duct diameter 3 mm. No evidence of choledocholithiasis. Pancreas: No pancreatic mass or duct dilation.  No pancreas divisum. Spleen: Normal size. No mass. Adrenals/Urinary Tract: Normal adrenals. There is asymmetric mild to moderate right hydroureteronephrosis to the level of upper pelvic right ureter, with extrinsic mass effect on upper right pelvic ureter by the dominant posterior uterine fibroid. There is a small to moderate amount of simple right perinephric fluid. No left hydronephrosis. No left perinephric fluid. No renal masses. Normal bladder. Stomach/Bowel: Normal non-distended stomach. Normal caliber small and large bowel. No bowel wall thickening. No mesenteric or pericolonic edema. Candidate normal appendix in the right lower quadrant (series 4/image 24). No pericecal inflammatory changes. Moderate transverse colonic gas. Vascular/Lymphatic: Normal caliber abdominal aorta. No pathologically enlarged lymph nodes in the abdomen. Reproductive: Enlarged gravid uterus with single intrauterine gestation in cephalic lie. The scan is not tailored for fetal evaluation. Amniotic fluid volume is subjectively normal. Posterior placenta without previa. Cervix length approximately 3.2 cm without evidence of internal cervical funneling. There are multiple (at least 4) uterine fibroids, largest 5.5 x 5.4 x 6.8 cm in posterior right uterine body (series 11/image 56). Normal ovaries bilaterally. No adnexal masses. Other: No abdominal ascites or focal fluid collection. Musculoskeletal: No aggressive appearing focal osseous lesions. IMPRESSION: 1. Mild to moderate asymmetric right hydroureteronephrosis to the level of the upper pelvic right ureter. Asymmetric right perinephric simple fluid. Extrinsic mass-effect on the right ureter by  the dominant 6.8 cm posterior right uterine body fibroid. Suspect right ureteral obstruction by the dominant right posterior uterine fibroid. 2. No evidence of acute appendicitis. Electronically Signed   By: Ilona Sorrel M.D.   On: 10/10/2019 18:24    Assessment: 23 y.o. [redacted]w[redacted]d here for antenatal surveillance during pregnancy.  Principle diagnosis: hydroureteronephrosis  Plan:  Labor: not present.   Fetal Wellbeing: Reassuring Cat 1 tracing.  Reactive NST   Right flank pain :  Leukocytosis could indicate early pyelonephritis, also perinephric fluid.  Will treat empirically with antibiotics while culture is pending.  Bactrim BID x 7 days.  Hydroureteronephrosis: secondary to compression from gravid fibroid uterus.  Mild to moderate, but symptomatic Will treat pain with percocet for now, hopefully we can get urology on board as outpatient and they can offer acceptable intervention for the remainder of her pregnancy,  unless the compression is temporary due to local compression by fibroid which may change location as the uterus grows.  Follow up with urology in the next week.  Referral message sent to [facility].  D/c home stable, precautions reviewed, prescriptions sent to pharmacy, follow-up as scheduled.   ----- Larey Days, MD Attending Obstetrician and Gynecologist [facility], Department of [facility]  61 minutes spent evaluating and treating this patient, including coordination of care, research of history, reviewing imaging and labs, discussion with radiology, radiology tech, nursing, and of course, writing this note.

## 2019-10-10 NOTE — Progress Notes (Signed)
Pt to be d/c home and to self care. Pt given teaching on when to seek medical attention (LOF, VB and decreased FM, etc..). Pt discussed medications and pain management. Pt verbalized understanding of d/c instructions.

## 2019-10-11 LAB — URINE CULTURE: Culture: 40000 — AB

## 2019-10-15 ENCOUNTER — Ambulatory Visit: Payer: Medicaid Other

## 2019-10-17 ENCOUNTER — Encounter: Payer: Self-pay | Admitting: Advanced Practice Midwife

## 2019-10-17 DIAGNOSIS — N200 Calculus of kidney: Secondary | ICD-10-CM | POA: Insufficient documentation

## 2019-10-18 ENCOUNTER — Encounter: Payer: Self-pay | Admitting: Urology

## 2019-10-18 ENCOUNTER — Other Ambulatory Visit: Payer: Self-pay

## 2019-10-18 ENCOUNTER — Ambulatory Visit (INDEPENDENT_AMBULATORY_CARE_PROVIDER_SITE_OTHER): Payer: Medicaid Other | Admitting: Urology

## 2019-10-18 VITALS — BP 127/80 | HR 131 | Ht 64.0 in | Wt 166.0 lb

## 2019-10-18 DIAGNOSIS — N1339 Other hydronephrosis: Secondary | ICD-10-CM | POA: Diagnosis not present

## 2019-10-18 DIAGNOSIS — N200 Calculus of kidney: Secondary | ICD-10-CM

## 2019-10-18 LAB — URINALYSIS, COMPLETE
Bilirubin, UA: POSITIVE — AB
Glucose, UA: NEGATIVE
Nitrite, UA: NEGATIVE
Specific Gravity, UA: 1.015 (ref 1.005–1.030)
Urobilinogen, Ur: 2 mg/dL — ABNORMAL HIGH (ref 0.2–1.0)
pH, UA: 6 (ref 5.0–7.5)

## 2019-10-18 LAB — MICROSCOPIC EXAMINATION

## 2019-10-18 NOTE — Progress Notes (Signed)
10/18/19 3:21 PM   Lydia Gallagher 1996/11/23 [id number]  CC: Right-sided flank pain  HPI: I saw Lydia Gallagher in urology clinic today for evaluation of right-sided flank pain.  She is a 23 year old female that is [redacted] weeks pregnant who was recently admitted to the [facility] service on 10/10/2019 for right-sided flank pain.  MRI at that time showed right-sided hydroureteronephrosis down to the level of a 7 cm fibroid at the posterior right uterus.  There is no evidence of urolithiasis.  She had no clinical or laboratory signs of infection and was discharged home with oral pain control with Tylenol and Percocet.  Urine culture on 10/14 showed 40 K lactobacillus species consistent with contamination.  There are no prior positive urine cultures in the chart.  She reports she continues to have mild to moderate intermittent right-sided flank and back pain.  She denies any dysuria, urgency, frequency, gross hematuria, fevers or chills.  There are no aggravating factors.  Severity is moderate.  Urinalysis today shows 0-5 WBCs, 3-10 RBCs, few bacteria, nitrite negative.  PMH: Past Medical History:  Diagnosis Date  . Anemia     Surgical History: Past Surgical History:  Procedure Laterality Date  . NO PAST SURGERIES      Allergies: No Known Allergies  Family History: No family history on file.  Social History:  reports that she has never smoked. She has never used smokeless tobacco. She reports that she does not drink alcohol or use drugs.  ROS: Please see flowsheet from today's date for complete review of systems.  Physical Exam: BP 127/80   Pulse (!) 131   Ht 5\' 4"  (1.626 m)   Wt 166 lb (75.3 kg)   LMP 03/10/2019   BMI 28.49 kg/m    Constitutional:  Alert and oriented, No acute distress. Cardiovascular: No clubbing, cyanosis, or edema. Respiratory: Normal respiratory effort, no increased work of breathing. GI: Abdomen is soft, nontender, nondistended, no abdominal masses GU: Right CVA  tenderness Lymph: No cervical or inguinal lymphadenopathy. Skin: No rashes, bruises or suspicious lesions. Neurologic: Grossly intact, no focal deficits, moving all 4 extremities. Psychiatric: Normal mood and affect.  Laboratory Data: Reviewed, see HPI  Pertinent Imaging: Reviewed, see HPI  Assessment & Plan:   In summary, the patient is a 23 year old female who is [redacted] weeks pregnant and was found to have right-sided mild to moderate hydroureteronephrosis down to a likely external obstruction from a uterine fibroid and a gravid uterus.  She has no clinical or laboratory signs of infection, and her pain is currently well controlled using accommodation of Tylenol and Percocet.  We had a long conversation about treatment options including observation with oral pain control, right ureteral stent placement, or nephrostomy tube.  We discussed the risks and benefits of these at length.  We specifically discussed the risks of anesthesia and radiation in the setting of pregnancy, as well as common stent related symptoms of dysuria, urgency, frequency, and flank pain.  As her pain is currently well controlled with oral pain medications, she is in agreement to pursue observation at this time.  We discussed if she developed infection and fever over 101, or uncontrolled pain this would warrant urgent intervention.  RTC 4 weeks for symptom check, sooner if any problems.  Return precautions discussed at length.  A total of 45 minutes were spent face-to-face with the patient, greater than 50% was spent in patient education, counseling, and coordination of care regarding hydroureteronephrosis secondary to fibroid uterus in the  setting of pregnancy, and treatment options.   Billey Co, [facility] [address] [place], [place] [address] [phone]

## 2019-10-22 ENCOUNTER — Ambulatory Visit: Payer: Medicaid Other | Admitting: Advanced Practice Midwife

## 2019-10-22 ENCOUNTER — Other Ambulatory Visit: Payer: Self-pay

## 2019-10-22 VITALS — Wt 166.0 lb

## 2019-10-22 DIAGNOSIS — Z3403 Encounter for supervision of normal first pregnancy, third trimester: Secondary | ICD-10-CM

## 2019-10-22 DIAGNOSIS — D259 Leiomyoma of uterus, unspecified: Secondary | ICD-10-CM

## 2019-10-22 NOTE — Progress Notes (Signed)
     TELEPHONE OBSTETRICS VISIT ENCOUNTER NOTE  I connected with Lydia Gallagher @ on 10/22/19 at  1:20 PM EDT by telephone at home and verified that I am speaking with the correct person using two identifiers.   I discussed the limitations, risks, security and privacy concerns of performing an evaluation and management service by telephone and the availability of in person appointments. I also discussed with the patient that there may be a patient responsible charge related to this service. The patient expressed understanding and agreed to proceed.  Subjective:  Lydia Gallagher is a 23 y.o. G1P0 at [redacted]w[redacted]d being followed for ongoing prenatal care.  She is currently monitored for the following issues for this high-risk pregnancy and has Supervision of low-risk first pregnancy; Fibroid, uterine; Simple ovarian cyst; Uterine fibroid complicating antenatal care, baby not yet delivered; Right-sided back pain; and Kidney stone 10/10/19 ER on their problem list.  Patient reports backache. Reports fetal movement. Denies any contractions, bleeding or leaking of fluid.   The following portions of the patient's history were reviewed and updated as appropriate: allergies, current medications, past family history, past medical history, past social history, past surgical history and problem list.   Objective:   General:  Alert, oriented and cooperative.   Mental Status: Normal mood and affect perceived. Normal judgment and thought content.  Rest of physical exam deferred due to type of encounter  Assessment and Plan:  Pregnancy: G1P0 at [redacted]w[redacted]d 1. Encounter for supervision of low-risk first pregnancy in third trimester Pt wants refill on #31 Percocet given to her in ER 10/10/19 for right flank pain.  MRI with enlarged right kidney and right fibroid.  Had f/u urological appt 10/18/19 and has f/u apt there 11/19/19.  Has been taking Percocet q 4 hrs or 2-3x daily for pain.  Discussed this med is highly addictive  and she needs to try 2 ES Tylenol, rest, warm bath or hot water bottle.   Reviewed 10/08/19 u/s at 26 2/7 wks with AFI =wnl, growth adequate.  EDC is by 07/02/19 u/s at 13 4/7.   Not employed Pt cannot find her BP cuff at home Taking FeS04 BID with water--to take with oj.  2. Uterine leiomyoma, unspecified location 6.8x6x5 cm on 10/08/19 u/s  Preterm labor symptoms and general obstetric precautions including but not limited to vaginal bleeding, contractions, leaking of fluid and fetal movement were reviewed in detail with the patient.  I discussed the assessment and treatment plan with the patient. The patient was provided an opportunity to ask questions and all were answered. The patient agreed with the plan and demonstrated an understanding of the instructions. The patient was advised to call back or seek an in-person office evaluation/go to the hospital for any urgent or concerning symptoms.  Please refer to After Visit Summary for other counseling recommendations.   I provided 10 minutes of non-face-to-face time during this encounter.  Return in about 2 weeks (around 11/05/2019) for routine PNC.  Future Appointments  Date Time Provider [place]  11/05/2019 10:40 AM [facility] PROVIDER [facility] None  11/19/2019 10:00 AM Billey Co, MD [facility] None    Herbie Saxon, [facility]

## 2019-10-22 NOTE — Progress Notes (Signed)
Call from patient who states she wants to switch maternity care from [facility] to [facility]. Patient states she will keep her appointment here on 11/05/2019 until she finds out from [facility] when she can have her first appointment there. Per Dava Najjar states they can send records to [facility] per consents signed previously. Patient counseled to call [facility] later this week to see if she can schedule appointment and to let us know if she wants to cancel 11/05/2019 appointment at [facility]. Patient agrees to plan.Jenetta Downer, RN

## 2019-10-22 NOTE — Progress Notes (Signed)
TC to patient for telehealth appointment. Patient identity verified, patient is at home and states this is a good time to talk. Weight assessed at home. Patient could not find her blood pressure cuff and states that when she finds it, she will call clinic with BP reading for today. Patient was seen at neurologist, and was given prescription for Percoset for her right flank pain (10/18/2019). Patient states she was told to talk with her OB provider to get a refill, and needs a refill now. Patient pharmacy verified. Patient states she will be available by phone for provider call for next 30-45 minutes. Next appointment scheduled for 11/05/2019.Marland KitchenJenetta Downer, RN

## 2019-10-23 ENCOUNTER — Telehealth: Payer: Self-pay

## 2019-10-23 NOTE — Telephone Encounter (Signed)
Call to client-received request for records to [facility]; states will be transferring but unsure if will be before 11/05/19 appt.; will call to cancel if has appt. Before 11/05/19 Debera Lat, RN

## 2019-11-02 LAB — OB RESULTS CONSOLE VARICELLA ZOSTER ANTIBODY, IGG: Varicella: IMMUNE

## 2019-11-02 LAB — OB RESULTS CONSOLE RUBELLA ANTIBODY, IGM: Rubella: IMMUNE

## 2019-11-05 ENCOUNTER — Telehealth: Payer: Self-pay

## 2019-11-05 ENCOUNTER — Ambulatory Visit: Payer: Self-pay

## 2019-11-05 NOTE — Telephone Encounter (Signed)
[facility] for follow-up; call to client to reschedule appt.; has transfer appt. @ [facility] 11/15/19.  Declines [facility] appt. Prior to transfer.  To call with ?'s or problems Debera Lat, RN

## 2019-11-19 ENCOUNTER — Ambulatory Visit: Payer: Medicaid Other | Admitting: Urology

## 2019-12-07 LAB — OB RESULTS CONSOLE RPR: RPR: NONREACTIVE

## 2019-12-07 LAB — OB RESULTS CONSOLE GC/CHLAMYDIA
Chlamydia: NEGATIVE
Gonorrhea: NEGATIVE

## 2019-12-07 LAB — OB RESULTS CONSOLE GBS: GBS: POSITIVE

## 2019-12-22 ENCOUNTER — Encounter: Payer: Self-pay | Admitting: Obstetrics and Gynecology

## 2019-12-22 ENCOUNTER — Other Ambulatory Visit: Payer: Self-pay

## 2019-12-22 ENCOUNTER — Inpatient Hospital Stay
Admission: EM | Admit: 2019-12-22 | Discharge: 2019-12-24 | DRG: 807 | Disposition: A | Discharge status: Home / Self Care | Payer: Medicaid Other

## 2019-12-22 DIAGNOSIS — Z20828 Contact with and (suspected) exposure to other viral communicable diseases: Secondary | ICD-10-CM | POA: Diagnosis present

## 2019-12-22 DIAGNOSIS — D649 Anemia, unspecified: Secondary | ICD-10-CM | POA: Diagnosis present

## 2019-12-22 DIAGNOSIS — O134 Gestational [pregnancy-induced] hypertension without significant proteinuria, complicating childbirth: Secondary | ICD-10-CM | POA: Diagnosis present

## 2019-12-22 DIAGNOSIS — O99824 Streptococcus B carrier state complicating childbirth: Secondary | ICD-10-CM | POA: Diagnosis present

## 2019-12-22 DIAGNOSIS — Z3A38 38 weeks gestation of pregnancy: Secondary | ICD-10-CM

## 2019-12-22 DIAGNOSIS — O3413 Maternal care for benign tumor of corpus uteri, third trimester: Secondary | ICD-10-CM | POA: Diagnosis present

## 2019-12-22 DIAGNOSIS — O9902 Anemia complicating childbirth: Secondary | ICD-10-CM | POA: Diagnosis present

## 2019-12-22 DIAGNOSIS — D259 Leiomyoma of uterus, unspecified: Secondary | ICD-10-CM | POA: Diagnosis present

## 2019-12-22 DIAGNOSIS — O139 Gestational [pregnancy-induced] hypertension without significant proteinuria, unspecified trimester: Secondary | ICD-10-CM | POA: Diagnosis present

## 2019-12-22 DIAGNOSIS — R030 Elevated blood-pressure reading, without diagnosis of hypertension: Secondary | ICD-10-CM | POA: Diagnosis present

## 2019-12-22 DIAGNOSIS — Z8759 Personal history of other complications of pregnancy, childbirth and the puerperium: Secondary | ICD-10-CM | POA: Diagnosis present

## 2019-12-22 DIAGNOSIS — O163 Unspecified maternal hypertension, third trimester: Secondary | ICD-10-CM | POA: Diagnosis present

## 2019-12-22 LAB — CBC
HCT: 31.9 % — ABNORMAL LOW (ref 36.0–46.0)
Hemoglobin: 10.8 g/dL — ABNORMAL LOW (ref 12.0–15.0)
MCH: 26.8 pg (ref 26.0–34.0)
MCHC: 33.9 g/dL (ref 30.0–36.0)
MCV: 79.2 fL — ABNORMAL LOW (ref 80.0–100.0)
Platelets: 285 10*3/uL (ref 150–400)
RBC: 4.03 MIL/uL (ref 3.87–5.11)
RDW: 15.1 % (ref 11.5–15.5)
WBC: 11.3 10*3/uL — ABNORMAL HIGH (ref 4.0–10.5)
nRBC: 0 % (ref 0.0–0.2)

## 2019-12-22 LAB — COMPREHENSIVE METABOLIC PANEL
ALT: 14 U/L (ref 0–44)
AST: 20 U/L (ref 15–41)
Albumin: 2.8 g/dL — ABNORMAL LOW (ref 3.5–5.0)
Alkaline Phosphatase: 220 U/L — ABNORMAL HIGH (ref 38–126)
Anion gap: 10 (ref 5–15)
BUN: 6 mg/dL (ref 6–20)
CO2: 21 mmol/L — ABNORMAL LOW (ref 22–32)
Calcium: 9 mg/dL (ref 8.9–10.3)
Chloride: 107 mmol/L (ref 98–111)
Creatinine, Ser: 0.66 mg/dL (ref 0.44–1.00)
GFR calc Af Amer: >60 mL/min (ref >60)
GFR calc non Af Amer: >60 mL/min (ref >60)
Glucose, Bld: 77 mg/dL (ref 70–99)
Potassium: 4.1 mmol/L (ref 3.5–5.1)
Sodium: 138 mmol/L (ref 135–145)
Total Bilirubin: 0.4 mg/dL (ref 0.3–1.2)
Total Protein: 6.7 g/dL (ref 6.5–8.1)

## 2019-12-22 LAB — PROTEIN / CREATININE RATIO, URINE
Creatinine, Urine: 194 mg/dL
Protein Creatinine Ratio: 0.09 mg/mg{Cre} (ref 0.00–0.15)
Total Protein, Urine: 17 mg/dL

## 2019-12-22 LAB — ABO/RH: ABO/RH(D): O POS

## 2019-12-22 LAB — RESPIRATORY PANEL BY RT PCR (FLU A&B, COVID)
Influenza A by PCR: NEGATIVE
Influenza B by PCR: NEGATIVE
SARS Coronavirus 2 by RT PCR: NEGATIVE

## 2019-12-22 LAB — TYPE AND SCREEN
ABO/RH(D): O POS
Antibody Screen: NEGATIVE

## 2019-12-22 LAB — LACTATE DEHYDROGENASE: LDH: 126 U/L (ref 98–192)

## 2019-12-22 MED ORDER — ACETAMINOPHEN 500 MG PO TABS: 1000.0000 mg | ORAL_TABLET | Freq: Four times a day (QID) | ORAL | Status: DC | PRN

## 2019-12-22 MED ORDER — LABETALOL HCL 5 MG/ML IV SOLN: 40.0000 mg | INTRAVENOUS | Status: DC | PRN

## 2019-12-22 MED ORDER — TERBUTALINE SULFATE 1 MG/ML IJ SOLN: 0.2500 mg | Freq: Once | INTRAMUSCULAR | Status: DC | PRN

## 2019-12-22 MED ORDER — LABETALOL HCL 5 MG/ML IV SOLN: 80.0000 mg | INTRAVENOUS | Status: DC | PRN

## 2019-12-22 MED ORDER — SODIUM CHLORIDE 0.9 % IV SOLN
5.0000 10*6.[IU] | Freq: Once | INTRAVENOUS | Status: AC
  Administered 2019-12-22: 5 10*6.[IU] via INTRAVENOUS
  Filled 2019-12-22: qty 5

## 2019-12-22 MED ORDER — OXYTOCIN 40 UNITS IN NORMAL SALINE INFUSION - SIMPLE MED
2.5000 [IU]/h | INTRAVENOUS | Status: DC
  Administered 2019-12-23: 2.5 [IU]/h via INTRAVENOUS

## 2019-12-22 MED ORDER — MISOPROSTOL 200 MCG PO TABS
ORAL_TABLET | ORAL | Status: AC
  Filled 2019-12-22: qty 4

## 2019-12-22 MED ORDER — LACTATED RINGERS IV SOLN: INTRAVENOUS | Status: DC

## 2019-12-22 MED ORDER — OXYTOCIN BOLUS FROM INFUSION
500.0000 mL | Freq: Once | INTRAVENOUS | Status: AC
  Administered 2019-12-23: 500 mL via INTRAVENOUS

## 2019-12-22 MED ORDER — ONDANSETRON HCL 4 MG/2ML IJ SOLN
4.0000 mg | Freq: Four times a day (QID) | INTRAMUSCULAR | Status: DC | PRN
  Administered 2019-12-23: 4 mg via INTRAVENOUS
  Filled 2019-12-22: qty 2

## 2019-12-22 MED ORDER — LACTATED RINGERS IV SOLN
500.0000 mL | INTRAVENOUS | Status: DC | PRN
  Administered 2019-12-23: 500 mL via INTRAVENOUS

## 2019-12-22 MED ORDER — OXYTOCIN 40 UNITS IN NORMAL SALINE INFUSION - SIMPLE MED: 1.0000 m[IU]/min | INTRAVENOUS | Status: DC

## 2019-12-22 MED ORDER — LIDOCAINE HCL (PF) 1 % IJ SOLN
INTRAMUSCULAR | Status: AC
  Filled 2019-12-22: qty 30

## 2019-12-22 MED ORDER — SOD CITRATE-CITRIC ACID 500-334 MG/5ML PO SOLN: 30.0000 mL | ORAL | Status: DC | PRN

## 2019-12-22 MED ORDER — OXYTOCIN 40 UNITS IN NORMAL SALINE INFUSION - SIMPLE MED
INTRAVENOUS | Status: AC
  Administered 2019-12-22: 2 m[IU]/min via INTRAVENOUS
  Filled 2019-12-22: qty 1000

## 2019-12-22 MED ORDER — AMMONIA AROMATIC IN INHA
RESPIRATORY_TRACT | Status: AC
  Filled 2019-12-22: qty 10

## 2019-12-22 MED ORDER — OXYTOCIN 10 UNIT/ML IJ SOLN
INTRAMUSCULAR | Status: AC
  Filled 2019-12-22: qty 2

## 2019-12-22 MED ORDER — LABETALOL HCL 5 MG/ML IV SOLN: 20.0000 mg | INTRAVENOUS | Status: DC | PRN

## 2019-12-22 MED ORDER — HYDRALAZINE HCL 20 MG/ML IJ SOLN: 10.0000 mg | INTRAMUSCULAR | Status: DC | PRN

## 2019-12-22 MED ORDER — PENICILLIN G 3 MILLION UNITS IVPB - SIMPLE MED
3.0000 10*6.[IU] | INTRAVENOUS | Status: DC
  Administered 2019-12-22 – 2019-12-23 (×3): 3 10*6.[IU] via INTRAVENOUS
  Filled 2019-12-22 (×4): qty 100

## 2019-12-22 MED ORDER — BUTORPHANOL TARTRATE 1 MG/ML IJ SOLN
1.0000 mg | INTRAMUSCULAR | Status: DC | PRN
  Administered 2019-12-23: 1 mg via INTRAVENOUS
  Filled 2019-12-22: qty 1

## 2019-12-22 MED ORDER — LIDOCAINE HCL (PF) 1 % IJ SOLN: 30.0000 mL | INTRAMUSCULAR | Status: DC | PRN

## 2019-12-22 NOTE — OB Triage Note (Signed)
Pt is a 23 yo G1P0 at [redacted]w[redacted]d that presents from ED with c/o elevated blood pressure at home. Pt states positive FM and denies VB, LOF. Initial BP 123/85 and cycling every 15 min.

## 2019-12-22 NOTE — H&P (Addendum)
OB History & Physical   History of Present Illness:  Chief Complaint: elevated blood pressure at home   HPI:  Vedanshi N Strength is a 23 y.o. G1P0 female at [redacted]w[redacted]d dated by LMP.  She presents to L&D for elevated blood pressure at home.  She had an elevated b/p of 142/82 in the clinic on 12/20/19.  F/U b/p was normal, 130/80.  PreE labs were also normal.  Blood pressure at home today was 133/92.  Presented to Labor and Delivery triage for further evaluation.    She reports:  -active fetal movement -no leakage of fluid -no vaginal bleeding -no contractions -denies HA, changes in vision, or RUQ pain   Pregnancy Issues: 1. Gestational Hypertension: range from 123-145/85-103 2. Anemia: 12/07/19 Hgb 10.2 --> 11.0 (12/20/19)-->10.8 (12/22/19) 3. Uterine fibroid: 11/30/19 [place]: fibroid right posterior=6.8 x 5.2 x 4.6cm   Maternal Medical History:   Past Medical History:  Diagnosis Date  . Anemia     Past Surgical History:  Procedure Laterality Date  . NO PAST SURGERIES      No Known Allergies  Prior to Admission medications   Medication Sig Start Date End Date Taking? Authorizing Provider  acetaminophen (TYLENOL) 500 MG tablet Take 500 mg by mouth every 6 (six) hours as needed for headache.   Yes [provider]  ferrous sulfate 325 (65 FE) MG EC tablet Take 325 mg by mouth 2 (two) times daily with a meal.   Yes [provider]  Prenatal Vit-Fe Fumarate-FA (PREPLUS) 27-1 MG TABS Take 1 tablet by mouth daily. 10/08/19  Yes Caren Macadam, MD  oxyCODONE-acetaminophen (PERCOCET) 5-325 MG tablet Take 1 tablet by mouth every 4 (four) hours as needed for moderate pain or severe pain. Patient not taking: Reported on 12/22/2019 10/10/19 10/09/20  Ward, Honor Loh, MD     Prenatal care site: [facility]   Social History: She  reports that she has never smoked. She has never used smokeless tobacco. She reports that she does not drink alcohol or use  drugs.  Family History: Reviewed family history, no pertinent family history, denies history of gyn cancers.   Review of Systems: A full review of systems was performed and negative except as noted in the HPI.    Physical Exam:  Vital Signs: BP 127/73   Pulse 85   Temp 98.6 F (37 C) (Oral)   Resp 16   Ht 5\' 4"  (1.626 m)   Wt 75.3 kg   LMP 03/10/2019   BMI 28.49 kg/m   General:   alert, cooperative and appears stated age  Skin:  normal and no rash or abnormalities  Neurologic:    Alert & oriented x 3  Lungs:   clear to auscultation bilaterally  Heart:   regular rate and rhythm, S1, S2 normal, no murmur, click, rub or gallop  Abdomen:  normal findings: bowel sounds normal and gravid  Pelvis:  External genitalia: normal general appearance  FHT:  135 BPM  Presentations: cephalic by Leopold's and SVE  Cervix:    Dilation: 2-3 cm   Effacement: 60 %   Station:  -2   Consistency: soft   Position: middle  Extremities: : non-tender, symmetric, no edema bilaterally.  DTRs: 2+/2+    EFW: 6 1/2lbs   Results for orders placed or performed during the hospital encounter of 12/22/19 (from the past 24 hour(s))  Respiratory Panel by RT PCR (Flu A&B, Covid) - Nasopharyngeal Swab     Status: None  Collection Time: 12/22/19  4:40 PM   Specimen: Nasopharyngeal Swab  Result Value Ref Range   SARS Coronavirus 2 by RT PCR NEGATIVE NEGATIVE   Influenza A by PCR NEGATIVE NEGATIVE   Influenza B by PCR NEGATIVE NEGATIVE  Protein / creatinine ratio, urine     Status: None   Collection Time: 12/22/19  5:08 PM  Result Value Ref Range   Creatinine, Urine 194 mg/dL   Total Protein, Urine 17 mg/dL   Protein Creatinine Ratio 0.09 0.00 - 0.15 mg/mg[Cre]  CBC     Status: Abnormal   Collection Time: 12/22/19  5:30 PM  Result Value Ref Range   WBC 11.3 (H) 4.0 - 10.5 K/uL   RBC 4.03 3.87 - 5.11 MIL/uL   Hemoglobin 10.8 (L) 12.0 - 15.0 g/dL   HCT 31.9 (L) 36.0 - 46.0 %   MCV 79.2 (L) 80.0 - 100.0  fL   MCH 26.8 26.0 - 34.0 pg   MCHC 33.9 30.0 - 36.0 g/dL   RDW 15.1 11.5 - 15.5 %   Platelets 285 150 - 400 K/uL   nRBC 0.0 0.0 - 0.2 %  Type and screen [place]     Status: None   Collection Time: 12/22/19  5:30 PM  Result Value Ref Range   ABO/RH(D) O POS    Antibody Screen NEG    Sample Expiration      12/25/2019,2359 Performed at [facility], [address] [place], [place] [address]   Comprehensive metabolic panel     Status: Abnormal   Collection Time: 12/22/19  5:30 PM  Result Value Ref Range   Sodium 138 135 - 145 mmol/L   Potassium 4.1 3.5 - 5.1 mmol/L   Chloride 107 98 - 111 mmol/L   CO2 21 (L) 22 - 32 mmol/L   Glucose, Bld 77 70 - 99 mg/dL   BUN 6 6 - 20 mg/dL   Creatinine, Ser 0.66 0.44 - 1.00 mg/dL   Calcium 9.0 8.9 - 10.3 mg/dL   Total Protein 6.7 6.5 - 8.1 g/dL   Albumin 2.8 (L) 3.5 - 5.0 g/dL   AST 20 15 - 41 U/L   ALT 14 0 - 44 U/L   Alkaline Phosphatase 220 (H) 38 - 126 U/L   Total Bilirubin 0.4 0.3 - 1.2 mg/dL   GFR calc non Af Amer >60 >60 mL/min   GFR calc Af Amer >60 >60 mL/min   Anion gap 10 5 - 15  Lactate dehydrogenase     Status: None   Collection Time: 12/22/19  5:30 PM  Result Value Ref Range   LDH 126 98 - 192 U/L  ABO/Rh     Status: None   Collection Time: 12/22/19  8:35 PM  Result Value Ref Range   ABO/RH(D)      O POS Performed at [facility], [address] [place], [place] [address]     Pertinent Results:  Prenatal Labs: Blood type/Rh O positive   Antibody screen neg  Rubella Immune  Varicella Immune  RPR NR  HBsAg Neg  HIV NR  GC neg  Chlamydia neg  Genetic screening negative  1 hour GTT 93  3 hour GTT   GBS Positive    FHT: FHR: 135 bpm, variability: moderate,  accelerations:  Present,  decelerations:  Absent Category/reactivity:  Category I TOCO: irregular, every 3-6 minutes, mild to palpation SVE: Dilation: 2.5 / Effacement (%): 60 / Station: -2    Cephalic  by  leopolds  Assessment:  Analiah SHANTELLE SIBLE is a 23 y.o. G1P0 female at [redacted]w[redacted]d with gestational hypertension.   Plan:  1. Admit to Labor & Delivery; consents reviewed and obtained - Asymptomatic covid-19 screening   2. Fetal Well being  - Fetal Tracing: category 1 - GBS positive, will do GBS prophylaxis  - Presentation: Celphalic confirmed by exam   3. Routine OB: - Prenatal labs reviewed, as above - Rh positive - CBC & T&S on admit - Regular diet, IVF   4. Induction of Labor for Gestational Hypertension  -  Contractions: external toco in place -  Pelvis adequate for trial of labor  -  Plan for induction with oxytocin -  Plan for continuous fetal monitoring  -  CMP, LDH, urine P/C ratio on admit  -  Maternal pain control as desired: IVPM, regional anesthesia -  Anticipate vaginal delivery  5. Post Partum Planning: - Infant feeding: Breast - Contraception: IUD  [name], CNM 12/23/2019 2:11 AM ----- Drinda Butts  Certified Nurse Midwife [facility], Department of [facility]

## 2019-12-22 NOTE — Progress Notes (Signed)
Labor Progress Note  Lydia Gallagher is a 23 y.o. G1P0 at [redacted]w[redacted]d by LMP admitted for induction of labor due to gestational hypertension.  Subjective: doing well, feels regular "tightening", denies painful contractions  Objective: BP 130/84   Pulse 83   Temp 98.1 F (36.7 C) (Oral)   Resp 16   Ht 5\' 4"  (1.626 m)   Wt 75.3 kg   LMP 03/10/2019   BMI 28.49 kg/m  Notable VS details: Reviewed, no episodes of severe range b/p's   Fetal Assessment: FHT:  FHR: 135 bpm, variability: moderate,  accelerations:  Present,  decelerations:  Absent Category/reactivity:  Category I UC:   irregular, every 2-5 minutes, palpate mild  SVE: deferred  Membrane status:Intact Amniotic color: N/A  Labs: Lab Results  Component Value Date   WBC 11.3 (H) 12/22/2019   HGB 10.8 (L) 12/22/2019   HCT 31.9 (L) 12/22/2019   MCV 79.2 (L) 12/22/2019   PLT 285 12/22/2019    Assessment / Plan: Induction of labor d/t Gestational HTN, latent phase  Labor: Titrating oxytocin, will continue to increase and consider AROM Preeclampsia:  no signs or symptoms of toxicity Fetal Wellbeing:  Category I Pain Control:  Labor support without medications I/D:  n/a   [name], CNM 12/22/2019, 10:33 PM

## 2019-12-23 ENCOUNTER — Inpatient Hospital Stay: Payer: Medicaid Other | Admitting: Anesthesiology

## 2019-12-23 ENCOUNTER — Encounter: Payer: Self-pay | Admitting: Obstetrics and Gynecology

## 2019-12-23 LAB — RPR: RPR Ser Ql: NONREACTIVE

## 2019-12-23 MED ORDER — TETANUS-DIPHTH-ACELL PERTUSSIS 5-2.5-18.5 LF-MCG/0.5 IM SUSP: 0.5000 mL | Freq: Once | INTRAMUSCULAR | Status: DC

## 2019-12-23 MED ORDER — PHENYLEPHRINE 40 MCG/ML (10ML) SYRINGE FOR IV PUSH (FOR BLOOD PRESSURE SUPPORT): 80.0000 ug | PREFILLED_SYRINGE | INTRAVENOUS | Status: DC | PRN

## 2019-12-23 MED ORDER — EPHEDRINE 5 MG/ML INJ: 10.0000 mg | INTRAVENOUS | Status: DC | PRN

## 2019-12-23 MED ORDER — ZOLPIDEM TARTRATE 5 MG PO TABS: 5.0000 mg | ORAL_TABLET | Freq: Every evening | ORAL | Status: DC | PRN

## 2019-12-23 MED ORDER — SODIUM CHLORIDE 0.9% FLUSH: 3.0000 mL | INTRAVENOUS | Status: DC | PRN

## 2019-12-23 MED ORDER — ONDANSETRON HCL 4 MG PO TABS: 4.0000 mg | ORAL_TABLET | ORAL | Status: DC | PRN

## 2019-12-23 MED ORDER — FERROUS SULFATE 325 (65 FE) MG PO TABS
325.0000 mg | ORAL_TABLET | Freq: Two times a day (BID) | ORAL | Status: DC
  Administered 2019-12-23 – 2019-12-24 (×2): 325 mg via ORAL
  Filled 2019-12-23 (×2): qty 1

## 2019-12-23 MED ORDER — SODIUM CHLORIDE 0.9% FLUSH
3.0000 mL | Freq: Two times a day (BID) | INTRAVENOUS | Status: DC
  Administered 2019-12-23: 3 mL via INTRAVENOUS

## 2019-12-23 MED ORDER — DIBUCAINE (PERIANAL) 1 % EX OINT: 1.0000 "application " | TOPICAL_OINTMENT | CUTANEOUS | Status: DC | PRN

## 2019-12-23 MED ORDER — LIDOCAINE-EPINEPHRINE (PF) 1.5 %-1:200000 IJ SOLN
INTRAMUSCULAR | Status: DC | PRN
  Administered 2019-12-23: 3 mL via EPIDURAL

## 2019-12-23 MED ORDER — FENTANYL 2.5 MCG/ML W/ROPIVACAINE 0.15% IN NS 100 ML EPIDURAL (ARMC): 12.0000 mL/h | EPIDURAL | Status: DC

## 2019-12-23 MED ORDER — FLEET ENEMA 7-19 GM/118ML RE ENEM: 1.0000 | ENEMA | Freq: Every day | RECTAL | Status: DC | PRN

## 2019-12-23 MED ORDER — DIPHENHYDRAMINE HCL 25 MG PO CAPS: 25.0000 mg | ORAL_CAPSULE | Freq: Four times a day (QID) | ORAL | Status: DC | PRN

## 2019-12-23 MED ORDER — BENZOCAINE-MENTHOL 20-0.5 % EX AERO
1.0000 "application " | INHALATION_SPRAY | CUTANEOUS | Status: DC | PRN
  Filled 2019-12-23: qty 56

## 2019-12-23 MED ORDER — SIMETHICONE 80 MG PO CHEW: 80.0000 mg | CHEWABLE_TABLET | ORAL | Status: DC | PRN

## 2019-12-23 MED ORDER — SENNOSIDES-DOCUSATE SODIUM 8.6-50 MG PO TABS
2.0000 | ORAL_TABLET | ORAL | Status: DC
  Administered 2019-12-23: 2 via ORAL
  Filled 2019-12-23: qty 2

## 2019-12-23 MED ORDER — PRENATAL MULTIVITAMIN CH
1.0000 | ORAL_TABLET | Freq: Every day | ORAL | Status: DC
  Administered 2019-12-23 – 2019-12-24 (×2): 1 via ORAL
  Filled 2019-12-23 (×2): qty 1

## 2019-12-23 MED ORDER — LACTATED RINGERS IV SOLN
500.0000 mL | Freq: Once | INTRAVENOUS | Status: AC
  Administered 2019-12-23: 500 mL via INTRAVENOUS

## 2019-12-23 MED ORDER — IBUPROFEN 600 MG PO TABS
600.0000 mg | ORAL_TABLET | Freq: Four times a day (QID) | ORAL | Status: DC
  Administered 2019-12-23 – 2019-12-24 (×3): 600 mg via ORAL
  Filled 2019-12-23 (×3): qty 1

## 2019-12-23 MED ORDER — WITCH HAZEL-GLYCERIN EX PADS: 1.0000 "application " | MEDICATED_PAD | CUTANEOUS | Status: DC | PRN

## 2019-12-23 MED ORDER — SODIUM CHLORIDE 0.9 % IV SOLN: 250.0000 mL | INTRAVENOUS | Status: DC | PRN

## 2019-12-23 MED ORDER — BISACODYL 10 MG RE SUPP: 10.0000 mg | Freq: Every day | RECTAL | Status: DC | PRN

## 2019-12-23 MED ORDER — LIDOCAINE HCL (PF) 1 % IJ SOLN
INTRAMUSCULAR | Status: DC | PRN
  Administered 2019-12-23: 2 mL via SUBCUTANEOUS

## 2019-12-23 MED ORDER — DIPHENHYDRAMINE HCL 50 MG/ML IJ SOLN: 12.5000 mg | INTRAMUSCULAR | Status: DC | PRN

## 2019-12-23 MED ORDER — ONDANSETRON HCL 4 MG/2ML IJ SOLN: 4.0000 mg | INTRAMUSCULAR | Status: DC | PRN

## 2019-12-23 MED ORDER — FENTANYL 2.5 MCG/ML W/ROPIVACAINE 0.15% IN NS 100 ML EPIDURAL (ARMC)
EPIDURAL | Status: AC
  Filled 2019-12-23: qty 100

## 2019-12-23 MED ORDER — MEASLES, MUMPS & RUBELLA VAC IJ SOLR
0.5000 mL | Freq: Once | INTRAMUSCULAR | Status: DC
  Filled 2019-12-23: qty 0.5

## 2019-12-23 MED ORDER — SODIUM CHLORIDE 0.9 % IV SOLN
INTRAVENOUS | Status: DC | PRN
  Administered 2019-12-23 (×2): 5 mL via EPIDURAL

## 2019-12-23 MED ORDER — ACETAMINOPHEN 325 MG PO TABS
650.0000 mg | ORAL_TABLET | ORAL | Status: DC | PRN
  Administered 2019-12-23: 650 mg via ORAL
  Filled 2019-12-23: qty 2

## 2019-12-23 MED ORDER — FENTANYL 2.5 MCG/ML W/ROPIVACAINE 0.15% IN NS 100 ML EPIDURAL (ARMC)
EPIDURAL | Status: DC | PRN
  Administered 2019-12-23: 12 mL/h via EPIDURAL

## 2019-12-23 MED ORDER — COCONUT OIL OIL
1.0000 "application " | TOPICAL_OIL | Status: DC | PRN
  Administered 2019-12-23: 1 via TOPICAL
  Filled 2019-12-23: qty 120

## 2019-12-23 NOTE — Progress Notes (Signed)
Labor Progress Note  Lydia Gallagher is a 23 y.o. G1P0 at [redacted]w[redacted]d by LMP admitted for induction of labor due to gestational hypertension.  Subjective: resting in bed, states contractions are starting to feel a little bit stronger  Objective: BP 127/73   Pulse 85   Temp 98.6 F (37 C) (Oral)   Resp 16   Ht 5\' 4"  (1.626 m)   Wt 75.3 kg   LMP 03/10/2019   BMI 28.49 kg/m  Notable VS details: reviewed   Fetal Assessment: FHT:  FHR: 135 bpm, variability: moderate,  accelerations:  Present,  decelerations:  Present occasional variable Category/reactivity:  Category II UC:   regular, every 2-3 minutes, palpate mild to mod SVE:  deferred Membrane status: Intact Amniotic color: N/A  Labs: Lab Results  Component Value Date   WBC 11.3 (H) 12/22/2019   HGB 10.8 (L) 12/22/2019   HCT 31.9 (L) 12/22/2019   MCV 79.2 (L) 12/22/2019   PLT 285 12/22/2019    Assessment / Plan: Induction of labor due to gestational hypertension,  progressing well on pitocin  Labor: Progressing on Pitocin, will continue to increase then AROM Preeclampsia:  no signs or symptoms of toxicity Fetal Wellbeing:  Category II Pain Control:  Labor support without medications I/D:  n/a  [name], CNM 12/23/2019, 2:13 AM

## 2019-12-23 NOTE — Discharge Instructions (Signed)

## 2019-12-23 NOTE — Progress Notes (Signed)
Labor Progress Note  Lydia Gallagher is a 23 y.o. G1P0 at [redacted]w[redacted]d by LMP admitted for induction of labor due to gestational hypertension.  Subjective: comfortable with epidural  Objective: BP (!) 140/95   Pulse 88   Temp 98.5 F (36.9 C) (Oral)   Resp 16   Ht 5\' 4"  (1.626 m)   Wt 75.3 kg   LMP 03/10/2019   BMI 28.49 kg/m  Notable VS details: Reviewed, no severe range b/p  Fetal Assessment: FHT:  FHR: 130 bpm, variability: moderate,  accelerations:  Present,  decelerations:  Absent Category/reactivity:  Category I UC:   regular, every 2-4 minutes, palpate moderate SVE:   6/90/-1 Membrane status: Bulging Amniotic color: N/A  Labs: Lab Results  Component Value Date   WBC 11.3 (H) 12/22/2019   HGB 10.8 (L) 12/22/2019   HCT 31.9 (L) 12/22/2019   MCV 79.2 (L) 12/22/2019   PLT 285 12/22/2019    Assessment / Plan: Induction of labor due to gestational hypertension,  progressing well on pitocin  Labor: Progressing well on pitocin. Discussed AROM.  Patient would like to wait for water to break on it's own.  Will consider AROM if arrest of dilation.   Preeclampsia:  no signs or symptoms of toxicity Fetal Wellbeing:  Category I Pain Control:  Epidural I/D:  n/a   Lydia Gallagher, CNM 12/23/2019, 6:46 AM

## 2019-12-23 NOTE — Anesthesia Preprocedure Evaluation (Signed)
Anesthesia Evaluation  Patient identified by MRN, date of birth, ID band Patient awake    Reviewed: Allergy & Precautions, H&P , NPO status , Patient's Chart, lab work & pertinent test results, reviewed documented beta blocker date and time   History of Anesthesia Complications Negative for: history of anesthetic complications  Airway Mallampati: II  TM Distance: >3 FB Neck ROM: full    Dental no notable dental hx.    Pulmonary neg pulmonary ROS,    Pulmonary exam normal        Cardiovascular Exercise Tolerance: Good hypertension (gestational), Normal cardiovascular exam     Neuro/Psych negative neurological ROS  negative psych ROS   GI/Hepatic negative GI ROS, Neg liver ROS,   Endo/Other  negative endocrine ROS  Renal/GU Renal disease (kidney stone)  negative genitourinary   Musculoskeletal   Abdominal   Peds  Hematology negative hematology ROS (+)   Anesthesia Other Findings Past Medical History: No date: Anemia   Reproductive/Obstetrics (+) Pregnancy                             Anesthesia Physical Anesthesia Plan  ASA: II  Anesthesia Plan: Epidural   Post-op Pain Management:    Induction:   PONV Risk Score and Plan:   Airway Management Planned:   Additional Equipment:   Intra-op Plan:   Post-operative Plan:   Informed Consent: I have reviewed the patients History and Physical, chart, labs and discussed the procedure including the risks, benefits and alternatives for the proposed anesthesia with the patient or authorized representative who has indicated his/her understanding and acceptance.     Dental Advisory Given  Plan Discussed with: Anesthesiologist, CRNA and Surgeon  Anesthesia Plan Comments:         Anesthesia Quick Evaluation

## 2019-12-23 NOTE — Discharge Summary (Signed)
Obstetrical Discharge Summary  Patient Name: Lydia Gallagher DOB: [birth year] MRN: [id number]  Date of Admission: 12/22/2019 Date of Delivery: 12/23/19 Delivered by: Drinda Butts, CNM Date of Discharge: 12/24/2019  Primary OB: [facility]  [id number] last menstrual period was 03/10/2019. EDC Estimated Date of Delivery: 01/03/20 Gestational Age at Delivery: [redacted]w[redacted]d   Antepartum complications: Gestational HTN, Anemia, uterine fibroid, GBS + Admitting Diagnosis: IOL for Gestational HTN Secondary Diagnosis: Patient Active Problem List   Diagnosis Date Noted  . Elevated blood pressure complicating pregnancy in third trimester, antepartum 12/22/2019  . Gestational hypertension affecting first pregnancy 12/22/2019  . Kidney stone 10/10/19 ER 10/17/2019  . Right-sided back pain 10/10/2019  . Uterine fibroid complicating antenatal care, baby not yet delivered 08/30/2019  . Supervision of low-risk first pregnancy 07/05/2019  . Fibroid, uterine 07/05/2019  . Simple ovarian cyst 07/05/2019    Induction: Pitocin Complications: None Intrapartum complications/course: Progressed well on pitocin, SROM for meconium.  Blood pressures remained normal to mildly elevated, no severe ranges were noted.  Delivery Type: spontaneous vaginal delivery Anesthesia: epidural Placenta: spontaneous Laceration: 1st degree vaginal, periurethral, and hymenal laceration  Episiotomy: none Newborn Data: Live born female "Skylar" Birth Weight: 5lbs 12oz  APGAR: 9/9  Newborn Delivery   Birth date/time: 12/23/2019 08:55:00 Delivery type: Vaginal, Spontaneous    Postpartum Procedures: None  Post partum course:  Patient had an uncomplicated postpartum course.  By time of discharge on PPD#1, her pain was controlled on oral pain medications; she had appropriate lochia and was ambulating, voiding without difficulty and tolerating regular diet.  She was deemed stable for discharge to home.    Discharge  Physical Exam:  BP 124/86   Pulse 73   Temp 98.6 F (37 C) (Oral)   Resp 20   Ht 5\' 4"  (1.626 m)   Wt 75.3 kg   LMP 03/10/2019   SpO2 98% Comment: RA  Breastfeeding Unknown   BMI 28.49 kg/m   General: NAD CV: RRR Pulm: CTABL, nl effort ABD: s/nd/nt, fundus firm and below the umbilicus Lochia: moderate DVT Evaluation: LE non-ttp, no evidence of DVT on exam.  Hemoglobin  Date Value Ref Range Status  12/24/2019 9.7 (L) 12.0 - 15.0 g/dL Final  10/08/2019 10.1 (L) 11.1 - 15.9 g/dL Final   HCT  Date Value Ref Range Status  12/24/2019 28.7 (L) 36.0 - 46.0 % Final   Hematocrit  Date Value Ref Range Status  10/08/2019 29.4 (L) 34.0 - 46.6 % Final    Disposition: stable, discharge to home. Baby Feeding: breast milk and formula Baby Disposition: home with mom  Rh Immune globulin given: Rh pos Rubella vaccine given: RI Flu vaccine given in AP or PP setting: Declined Tdap vaccine given in AP setting: 10/08/19  Contraception: pills  Prenatal Labs:  Blood type/Rh O positive   Antibody screen neg  Rubella Immune  Varicella Immune  RPR NR  HBsAg Neg  HIV NR  GC neg  Chlamydia neg  Genetic screening negative  1 hour GTT 93  3 hour GTT   GBS Positive     Plan:  Lydia Gallagher was discharged to home in good condition. Follow-up appointment with delivering provider in 6 weeks.  Discharge Medications: Allergies as of 12/24/2019   No Known Allergies     Medication List    TAKE these medications   acetaminophen 500 MG tablet Commonly known as: TYLENOL Take 500 mg by mouth every 6 (six) hours as needed for headache.   docusate  sodium 100 MG capsule Commonly known as: Colace Take 1 capsule (100 mg total) by mouth 2 (two) times daily for 14 days. To keep stools soft, as needed   ferrous sulfate 325 (65 FE) MG EC tablet Take 325 mg by mouth 2 (two) times daily with a meal. What changed: Another medication with the same name was added. Make sure you understand  how and when to take each.   ferrous sulfate 325 (65 FE) MG tablet Take 1 tablet (325 mg total) by mouth daily with breakfast. Take with Vitamin C What changed: You were already taking a medication with the same name, and this prescription was added. Make sure you understand how and when to take each.   ibuprofen 800 MG tablet Commonly known as: ADVIL Take 1 tablet (800 mg total) by mouth every 8 (eight) hours as needed for moderate pain or cramping.   PrePLUS 27-1 MG Tabs Take 1 tablet by mouth daily.        Signed: Benjaman Kindler 12/24/19

## 2019-12-23 NOTE — Anesthesia Procedure Notes (Signed)
Epidural Patient location during procedure: OB Start time: 12/23/2019 5:50 AM End time: 12/23/2019 5:59 AM  Staffing Anesthesiologist: Martha Clan, MD Performed: anesthesiologist   Preanesthetic Checklist Completed: patient identified, IV checked, site marked, risks and benefits discussed, surgical consent, monitors and equipment checked, pre-op evaluation and timeout performed  Epidural Patient position: sitting Prep: ChloraPrep Patient monitoring: heart rate, continuous pulse ox and blood pressure Approach: midline Location: L3-L4 Injection technique: LOR saline  Needle:  Needle type: Tuohy  Needle gauge: 17 G Needle length: 9 cm and 9 Needle insertion depth: 5 cm Catheter type: closed end flexible Catheter size: 19 Gauge Catheter at skin depth: 10 cm Test dose: negative and 1.5% lidocaine with Epi 1:200 K  Assessment Sensory level: T10 Events: blood not aspirated, injection not painful, no injection resistance, no paresthesia and negative IV test  Additional Notes 1st attempt Pt. Evaluated and documentation done after procedure finished. Patient identified. Risks/Benefits/Options discussed with patient including but not limited to bleeding, infection, nerve damage, paralysis, failed block, incomplete pain control, headache, blood pressure changes, nausea, vomiting, reactions to medication both or allergic, itching and postpartum back pain. Confirmed with bedside nurse the patient's most recent platelet count. Confirmed with patient that they are not currently taking any anticoagulation, have any bleeding history or any family history of bleeding disorders. Patient expressed understanding and wished to proceed. All questions were answered. Sterile technique was used throughout the entire procedure. Please see nursing notes for vital signs. Test dose was given through epidural catheter and negative prior to continuing to dose epidural or start infusion. Warning signs of high  block given to the patient including shortness of breath, tingling/numbness in hands, complete motor block, or any concerning symptoms with instructions to call for help. Patient was given instructions on fall risk and not to get out of bed. All questions and concerns addressed with instructions to call with any issues or inadequate analgesia.   Patient tolerated the insertion well without immediate complications.Reason for block:procedure for pain

## 2019-12-24 LAB — CBC
HCT: 28.7 % — ABNORMAL LOW (ref 36.0–46.0)
Hemoglobin: 9.7 g/dL — ABNORMAL LOW (ref 12.0–15.0)
MCH: 26.9 pg (ref 26.0–34.0)
MCHC: 33.8 g/dL (ref 30.0–36.0)
MCV: 79.7 fL — ABNORMAL LOW (ref 80.0–100.0)
Platelets: 242 10*3/uL (ref 150–400)
RBC: 3.6 MIL/uL — ABNORMAL LOW (ref 3.87–5.11)
RDW: 15 % (ref 11.5–15.5)
WBC: 9.7 10*3/uL (ref 4.0–10.5)
nRBC: 0 % (ref 0.0–0.2)

## 2019-12-24 MED ORDER — FERROUS SULFATE 325 (65 FE) MG PO TABS: 325.0000 mg | ORAL_TABLET | Freq: Every day | ORAL | 1 refills | Status: DC

## 2019-12-24 MED ORDER — IBUPROFEN 800 MG PO TABS: 800.0000 mg | ORAL_TABLET | Freq: Three times a day (TID) | ORAL | 1 refills | Status: DC | PRN

## 2019-12-24 MED ORDER — DOCUSATE SODIUM 100 MG PO CAPS: 100.0000 mg | ORAL_CAPSULE | Freq: Two times a day (BID) | ORAL | 0 refills | Status: AC

## 2019-12-24 MED ORDER — IBUPROFEN 600 MG PO TABS
600.0000 mg | ORAL_TABLET | Freq: Four times a day (QID) | ORAL | Status: DC
  Administered 2019-12-24: 600 mg via ORAL
  Filled 2019-12-24: qty 1

## 2019-12-24 NOTE — Anesthesia Postprocedure Evaluation (Signed)
Anesthesia Post Note  Patient: Lydia Gallagher  Procedure(s) Performed: AN AD HOC LABOR EPIDURAL  Patient location during evaluation: Mother Baby Anesthesia Type: Epidural Level of consciousness: awake and alert Pain management: pain level controlled Vital Signs Assessment: post-procedure vital signs reviewed and stable Respiratory status: spontaneous breathing, nonlabored ventilation and respiratory function stable Cardiovascular status: stable Postop Assessment: no headache, no backache and epidural receding Anesthetic complications: no     Last Vitals:  Vitals:   12/23/19 2250 12/24/19 0254  BP: 126/83 124/84  Pulse: 77 73  Resp: 20 20  Temp: 37.1 C 36.7 C  SpO2: 98% 99%    Last Pain:  Vitals:   12/24/19 0750  TempSrc:   PainSc: 0-No pain                 Wilgus Deyton B Clarisa Kindred

## 2019-12-24 NOTE — Progress Notes (Signed)
Pt discharged with infant.  Discharge instructions, prescriptions and follow up appointment given to and reviewed with pt. Pt verbalized understanding. Escorted out by staff. 

## 2019-12-28 HISTORY — PX: OTHER SURGICAL HISTORY: SHX169

## 2020-04-24 ENCOUNTER — Ambulatory Visit: Payer: Medicaid Other

## 2020-05-07 ENCOUNTER — Ambulatory Visit: Payer: Medicaid Other

## 2020-05-07 ENCOUNTER — Other Ambulatory Visit: Payer: Self-pay

## 2020-05-12 ENCOUNTER — Ambulatory Visit: Payer: Medicaid Other

## 2020-05-28 ENCOUNTER — Ambulatory Visit: Payer: Medicaid Other

## 2020-05-29 ENCOUNTER — Ambulatory Visit: Payer: Self-pay | Admitting: Physician Assistant

## 2020-05-29 ENCOUNTER — Other Ambulatory Visit: Payer: Self-pay

## 2020-05-29 DIAGNOSIS — Z113 Encounter for screening for infections with a predominantly sexual mode of transmission: Secondary | ICD-10-CM

## 2020-05-29 DIAGNOSIS — N760 Acute vaginitis: Secondary | ICD-10-CM

## 2020-05-29 DIAGNOSIS — B9689 Other specified bacterial agents as the cause of diseases classified elsewhere: Secondary | ICD-10-CM

## 2020-05-29 LAB — WET PREP FOR TRICH, YEAST, CLUE
Trichomonas Exam: NEGATIVE
Yeast Exam: NEGATIVE

## 2020-05-29 MED ORDER — METRONIDAZOLE 500 MG PO TABS: 500.0000 mg | ORAL_TABLET | Freq: Two times a day (BID) | ORAL | 0 refills | Status: AC

## 2020-05-29 NOTE — Progress Notes (Signed)
Wet mount reviewed with provider and pt treated for BV per Antoine Primas, PA order. Provider orders completed.

## 2020-05-30 ENCOUNTER — Encounter: Payer: Self-pay | Admitting: Physician Assistant

## 2020-05-30 NOTE — Progress Notes (Signed)
[facility] Department STI clinic/screening visit  Subjective:  Lydia Gallagher is a 24 y.o. female being seen today for an STI screening visit. The patient reports they do have symptoms.  Patient reports that they do not desire a pregnancy in the next year.   They reported they are not interested in discussing contraception today.  No LMP recorded (approximate).   Patient has the following medical conditions:   Patient Active Problem List   Diagnosis Date Noted  . Elevated blood pressure complicating pregnancy in third trimester, antepartum 12/22/2019  . Gestational hypertension affecting first pregnancy 12/22/2019  . Kidney stone 10/10/19 ER 10/17/2019  . Right-sided back pain 10/10/2019  . Uterine fibroid complicating antenatal care, baby not yet delivered 08/30/2019  . Supervision of low-risk first pregnancy 07/05/2019  . Fibroid, uterine 07/05/2019  . Simple ovarian cyst 07/05/2019    Chief Complaint  Patient presents with  . SEXUALLY TRANSMITTED DISEASE    screening    HPI  Patient reports that she has had a "foul smell" and yellowish discharge for 3 days.  Denies other symptoms.  Denies chronic conditions and h/o surgery.  LMP 05/15/2020 and using OCs as BCM.  Last HIV test was 2 months ago per patient.   See flowsheet for further details and programmatic requirements.    The following portions of the patient's history were reviewed and updated as appropriate: allergies, current medications, past medical history, past social history, past surgical history and problem list.  Objective:  There were no vitals filed for this visit.  Physical Exam Constitutional:      General: She is not in acute distress.    Appearance: Normal appearance.  HENT:     Head: Normocephalic and atraumatic.     Comments: No nits, lice, or hair loss. No cervical, supraclavicular or axillary adenopathy.    Mouth/Throat:     Mouth: Mucous membranes are moist.     Pharynx:  Oropharynx is clear. No oropharyngeal exudate or posterior oropharyngeal erythema.  Eyes:     Conjunctiva/sclera: Conjunctivae normal.  Pulmonary:     Effort: Pulmonary effort is normal.  Abdominal:     Palpations: Abdomen is soft. There is no mass.     Tenderness: There is no abdominal tenderness. There is no guarding or rebound.  Genitourinary:    General: Normal vulva.     Rectum: Normal.     Comments: External genitalia/pubic area without nits, lice, edema, erythema, lesions and inguinal adenopathy. Vagina with normal mucosa and small amount of whitish discharge, pH=>4.5. Cervix without visible lesions. Uterus firm, mobile, nt, no masses, no CMT, no adnexal tenderness or fullness. Musculoskeletal:     Cervical back: Neck supple. No tenderness.  Skin:    General: Skin is warm and dry.     Findings: No bruising, erythema, lesion or rash.  Neurological:     Mental Status: She is alert and oriented to person, place, and time.  Psychiatric:        Mood and Affect: Mood normal.        Behavior: Behavior normal.        Thought Content: Thought content normal.        Judgment: Judgment normal.      Assessment and Plan:  Lydia Gallagher is a 24 y.o. female presenting to the [facility] Department for STI screening  1. Screening for STD (sexually transmitted disease) Patient into clinic with symptoms.  Declines blood work today. Rec condoms with all sex.  Await test results.  Counseled that RN will call if needs to RTC for further treatment once results are back.  - WET PREP FOR [employer], [facility], [place] [facility]  2. BV (bacterial vaginosis) Treat for BV with Metronidazole 500mg  # 14 1 po BID for 7 days with food, no EtOH for 24 hr before and until 72 hr after completing medicine. No sex for 7 days. Enc to use OTC antifungal cream if has itching during or just after using antibiotic. - metroNIDAZOLE (FLAGYL) 500 MG tablet; Take 1 tablet (500 mg  total) by mouth 2 (two) times daily for 7 days.  Dispense: 14 tablet; Refill: 0     Return if symptoms worsen or fail to improve.  No future appointments.  Jerene Dilling, PA

## 2020-07-15 ENCOUNTER — Ambulatory Visit: Payer: Medicaid Other | Admitting: Family Medicine

## 2020-07-15 ENCOUNTER — Encounter: Payer: Self-pay | Admitting: Family Medicine

## 2020-07-15 ENCOUNTER — Other Ambulatory Visit: Payer: Self-pay

## 2020-07-15 DIAGNOSIS — Z113 Encounter for screening for infections with a predominantly sexual mode of transmission: Secondary | ICD-10-CM | POA: Diagnosis not present

## 2020-07-15 DIAGNOSIS — D619 Aplastic anemia, unspecified: Secondary | ICD-10-CM

## 2020-07-15 LAB — WET PREP FOR TRICH, YEAST, CLUE
Trichomonas Exam: NEGATIVE
Yeast Exam: NEGATIVE

## 2020-07-15 LAB — HEMOGLOBIN, FINGERSTICK: Hemoglobin: 12 g/dL (ref 11.1–15.9)

## 2020-07-15 NOTE — Progress Notes (Signed)
[facility] Department STI clinic/screening visit  Subjective:  Lydia Gallagher is a 24 y.o. female being seen today for an STI screening visit. The patient reports they do not have symptoms.  Patient reports that they do not desire a pregnancy in the next year.   They reported they are not interested in discussing contraception today.  Patient's last menstrual period was 06/15/2020 (approximate).   Patient has the following medical conditions:   Patient Active Problem List   Diagnosis Date Noted  . Elevated blood pressure complicating pregnancy in third trimester, antepartum 12/22/2019  . Gestational hypertension affecting first pregnancy 12/22/2019  . Kidney stone 10/10/19 ER 10/17/2019  . Right-sided back pain 10/10/2019  . Uterine fibroid complicating antenatal care, baby not yet delivered 08/30/2019  . Supervision of low-risk first pregnancy 07/05/2019  . Fibroid, uterine 07/05/2019  . Simple ovarian cyst 07/05/2019    No chief complaint on file.   HPI  Patient reports she is here for STD screen.  Client reports she has no STD symptoms.   Client states that she has been having headaches since she started her OCPs  12/2019.  She was prescribed OCPs at her 6wks PP visit @ [place] [facility]. She takes pills daily, denies missing pills or taking pills late She was treated for anemia during her pregnancy and denies h/o migraines  Last HIV test per patient/review of record was 2020 Patient reports last pap was 05/2019   See flowsheet for further details and programmatic requirements.    The following portions of the patient's history were reviewed and updated as appropriate: allergies, current medications, past medical history, past social history, past surgical history and problem list.  Objective:  There were no vitals filed for this visit.  Physical Exam Vitals and nursing note reviewed.  Constitutional:      Appearance: Normal appearance.  HENT:     Head:  Normocephalic and atraumatic.     Mouth/Throat:     Mouth: Mucous membranes are moist.     Pharynx: Oropharynx is clear. No oropharyngeal exudate or posterior oropharyngeal erythema.  Pulmonary:     Effort: Pulmonary effort is normal.  Abdominal:     General: Abdomen is flat.     Palpations: There is no mass.     Tenderness: There is no abdominal tenderness. There is no rebound.  Genitourinary:    General: Normal vulva.     Exam position: Lithotomy position.     Pubic Area: No rash or pubic lice.      Labia:        Right: No rash or lesion.        Left: No rash or lesion.      Vagina: Normal. No vaginal discharge, erythema, bleeding or lesions.     Cervix: No cervical motion tenderness, discharge, friability, lesion or erythema.     Uterus: Normal.      Adnexa: Right adnexa normal and left adnexa normal.     Rectum: Normal.     Comments: Bimanual not indicated Lymphadenopathy:     Head:     Right side of head: No preauricular or posterior auricular adenopathy.     Left side of head: No preauricular or posterior auricular adenopathy.     Cervical: No cervical adenopathy.     Upper Body:     Right upper body: No supraclavicular or axillary adenopathy.     Left upper body: No supraclavicular or axillary adenopathy.     Lower Body: No right  inguinal adenopathy. No left inguinal adenopathy.  Skin:    General: Skin is warm and dry.     Findings: No rash.  Neurological:     Mental Status: She is alert and oriented to person, place, and time.    Assessment and Plan:  Lydia Gallagher is a 24 y.o. female presenting to the [facility] Department for STI screening  1. Screening examination for venereal disease Treat wet prep as per SO. - WET PREP FOR [place], YEAST, CLUE - Chlamydia/Gonorrhea [place] [facility] - HIV [place] [facility] - Syphilis Serology,  Lab  2. Aplastic anemia (HCC)  - Hemoglobin, venipuncture HGB= 12    No follow-ups on file.  No future  appointments.  Hassell Done, FNP

## 2020-07-15 NOTE — Progress Notes (Signed)
Wet mount reviewed and is negative today, so no treatment needed for wet mount per standing order. Hgb 12.0 and reviewed with provider. Per Hassell Done, FNP Hgb is good. Counseled pt per Hassell Done, FNP verbal order that if headaches continue to RTC and pt states understanding. Provider orders completed.

## 2020-07-25 ENCOUNTER — Telehealth: Payer: Self-pay | Admitting: Family Medicine

## 2020-07-25 NOTE — Telephone Encounter (Signed)
Call to patient to discuss positive TR.  Patient verified by password.  Patient informed of + chlamydia.  Patient schedueld for treatment appointment.  Patient verbalizes understanding at this time and has no further questions.  Junious Dresser, RN

## 2020-07-28 ENCOUNTER — Other Ambulatory Visit: Payer: Self-pay

## 2020-07-28 ENCOUNTER — Ambulatory Visit: Payer: Self-pay

## 2020-07-28 DIAGNOSIS — A749 Chlamydial infection, unspecified: Secondary | ICD-10-CM

## 2020-07-28 MED ORDER — AZITHROMYCIN 500 MG PO TABS
1000.0000 mg | ORAL_TABLET | Freq: Once | ORAL | Status: AC
  Administered 2020-07-28: 1000 mg via ORAL

## 2020-10-18 IMAGING — US US MFM OB FOLLOW-UP
1 series · 12 of 28 positions shown · non-contrast
Comparison: none

PATIENT INFO:

PERFORMED BY:
                   Sonographer
                   ISSAC BOSQUE
SERVICE(S) PROVIDED:
 ----------------------------------------------------------------------
INDICATIONS:
  27 weeks gestation of pregnancy
FETAL EVALUATION:
 Num Of Fetuses:         1
 Fetal Heart Rate(bpm):  143
 Cardiac Activity:       Present
 Presentation:           Cephalic
 Placenta:               Posterior, fundal
 AFI Sum(cm)     %Tile       Largest Pocket(cm)
 12.38           31
 RUQ(cm)       RLQ(cm)       LUQ(cm)        LLQ(cm)
BIOMETRY:
 BPD:      60.8  mm     G. Age:  24w 5d        < 1  %    CI:        63.87   %    70 - 86
                                                         FL/HC:      19.1   %    18.8 -
 HC:      244.9  mm     G. Age:  26w 4d          5  %    HC/AC:      1.03        1.05 -
 AC:      237.3  mm     G. Age:  28w 0d         56  %    FL/BPD:     77.0   %    71 - 87
 FL:       46.8  mm     G. Age:  25w 4d        < 3  %    FL/AC:      19.7   %    20 - 24
 HUM:      41.3  mm     G. Age:  25w 0d        < 5  %
 Est. FW:     991  gm      2 lb 3 oz     19  %
GESTATIONAL AGE:
 LMP:           30w 2d        Date:  03/10/19                 EDD:   12/15/19
 U/S Today:     26w 2d                                        EDD:   01/12/20
ANATOMY:
 Cranium:               Within Normal Limits   LVOT:                   Normal appearance
 Cavum:                 Within Normal Limits   Diaphragm:              Within Normal Limits
 Ventricles:            Within Normal Limits   Stomach:                Seen
 Posterior Fossa:       Within Normal Limits   Abdomen:                Within Normal
                                                                       Limits
 Face:                  Within Normal Limits   Kidneys:                Within Normal Limits
 Lips:                  Within Normal Limits   Bladder:                Seen
 Heart:                 4-Chamber view         Spine:                  Normal appearance
                        appears normal
 RVOT:                  Normal appearance
CERVIX UTERUS ADNEXA:
 Cervix
 Length:            3.1  cm.
MYOMAS:
  Site                     L(cm)      W(cm)      D(cm)      Location
  Blood Flow                 RI        PI       Comments

[Series 1: us mfm ob follow-up · 0.25mm/px · 12 of 52 slices shown]
[im 2/52]
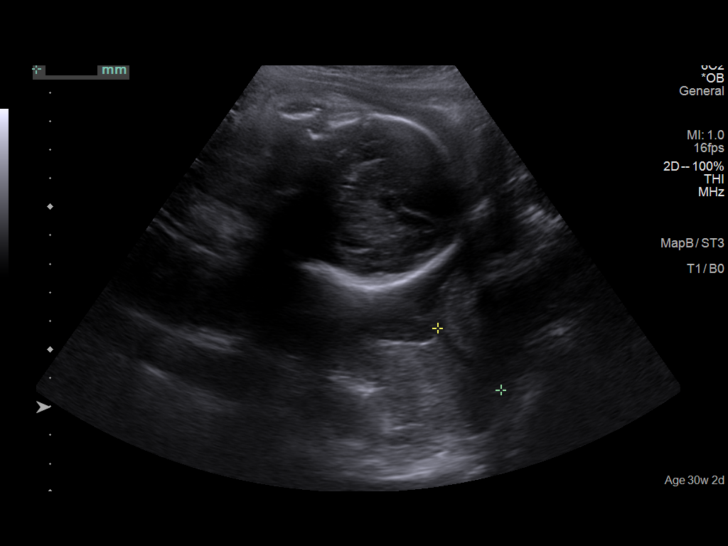
[im 6/52]
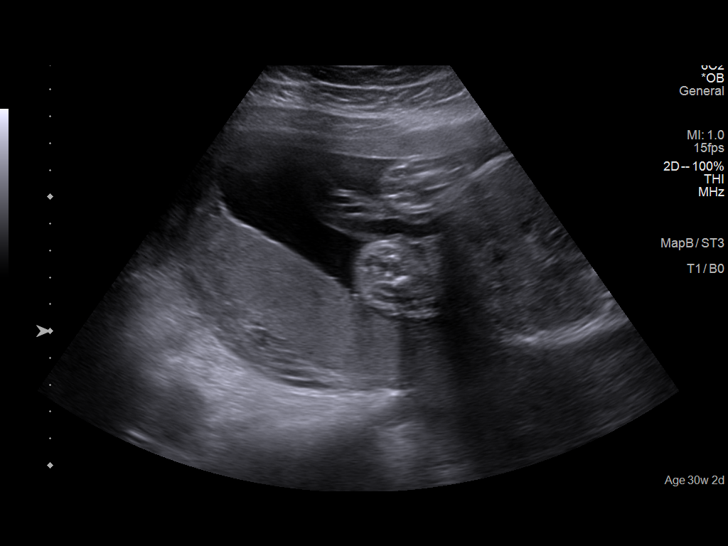
[im 10/52]
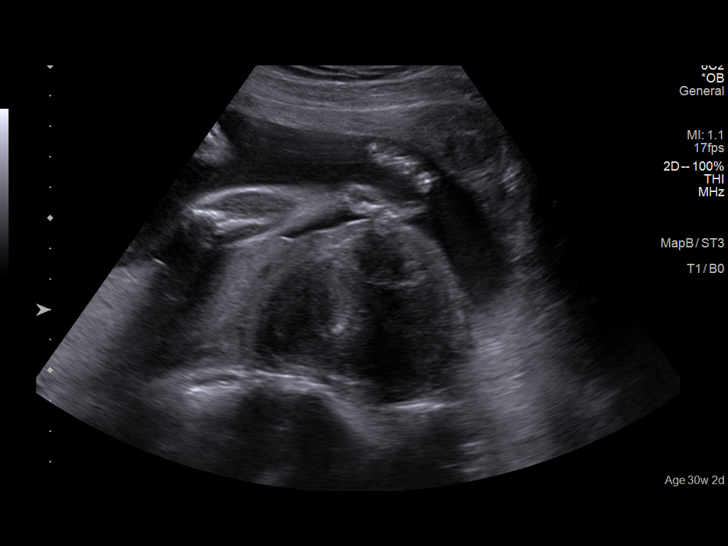
[im 16/52]
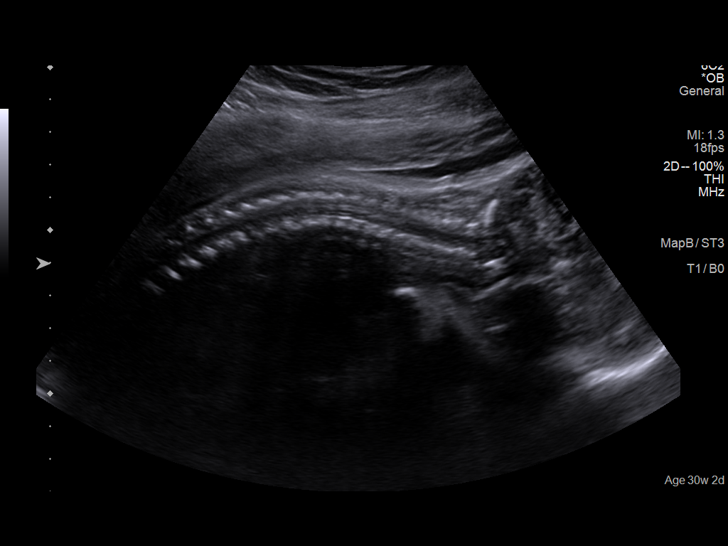
[im 19/52]
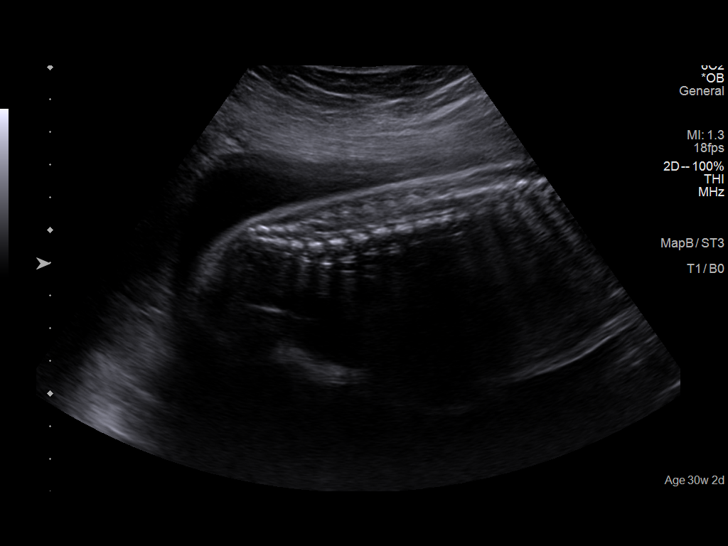
[im 23/52]
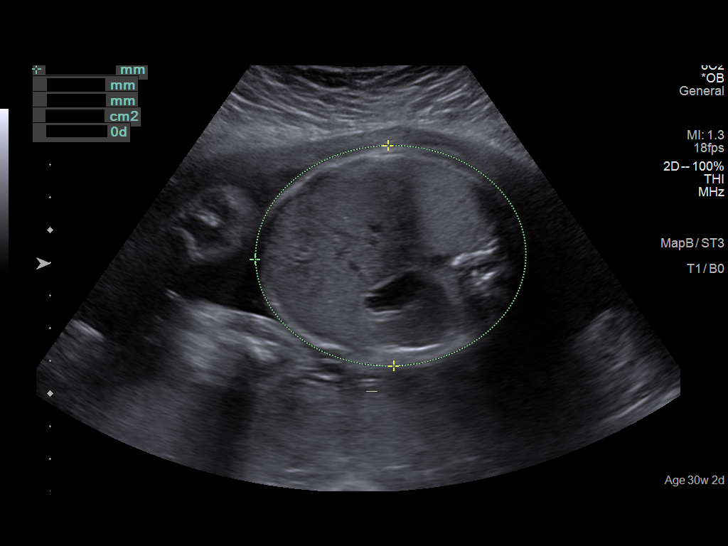
[im 29/52]
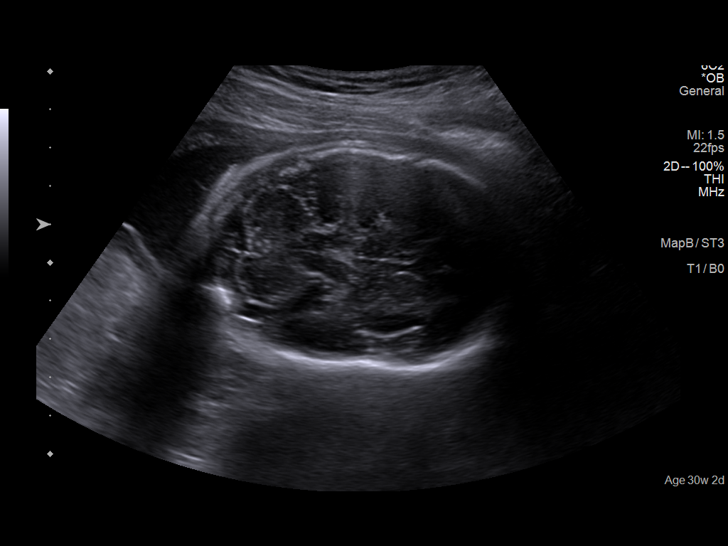
[im 33/52]
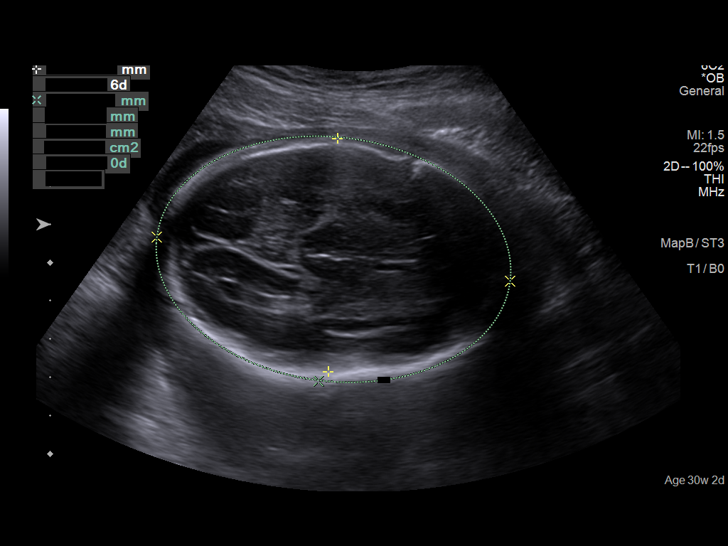
[im 36/52]
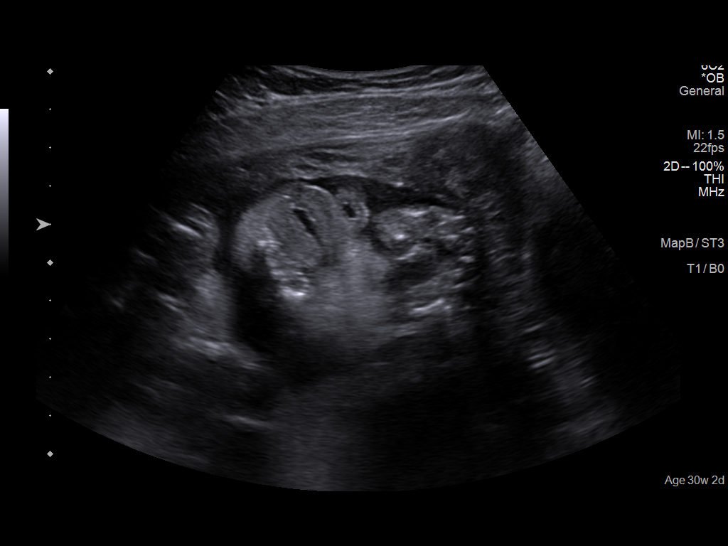
[im 42/52]
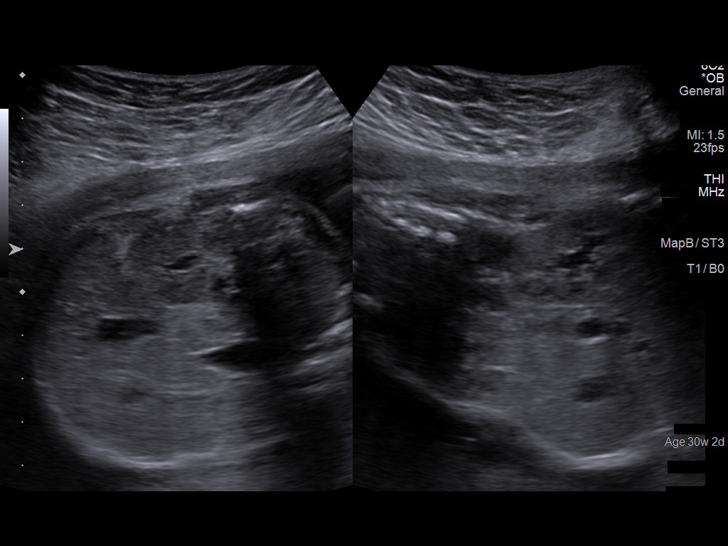
[im 46/52]
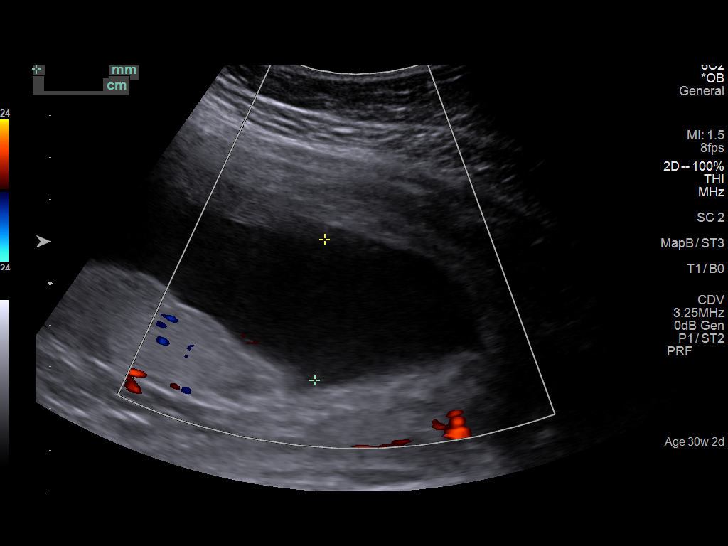
[im 50/52]
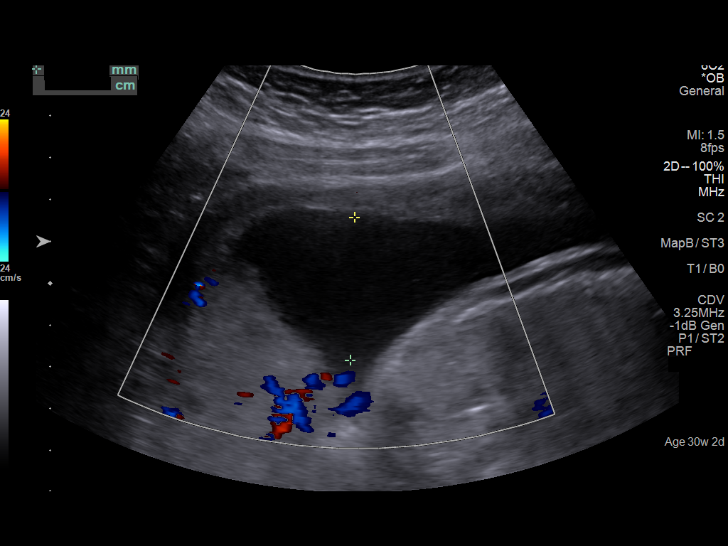

[12 of 28 positions shown; findings below may reference images not displayed]

IMPRESSION: Jekope Aburrow

 Thank you for referring your patient for a fetal growth
 evaluation and follow up of fibroid

 There is a singleton gestation with normal amniotic fluid
 volume.

 The fetal biometry correlates with established dating.
 The fetus is currently at 27w 4d based on earliest scan
 perfromed at [facility] on 07/02/2019 at 13w 4d .
 Adequate interval growth noted.
 Routine f/u anatomy appears normal .
 The estimated fetal weight is at the05 th    percentile. CEEJAY.
 fibroid is a similar size  at  6.9 x 6.1 x 5.0 cm ( previously at
 6.4 x4.5 x6.3)

 Recommend follow-up scan for fetal growth as clinically
 indicated. Given normal EFW - I did nto order a follow up .

 Thank you for allowing us to participate in your patient's care.
 assistance.

## 2020-10-20 IMAGING — MR MR PELVIS W/O CM
7 of 9 series · 31 of 48 positions shown · non-contrast
Comparison: None.

CLINICAL DATA: 23-year-old pregnant female with right sided pain
radiating to the low back for 1 day.

EXAM:
MRI ABDOMEN AND PELVIS WITHOUT CONTRAST
TECHNIQUE: Multiplanar multisequence MR imaging of the abdomen and pelvis was
performed. No intravenous contrast was administered.

[Series 4: cor haste · coronal · 5.0mm · 1.25mm/px · 4 of 44 slices shown]
[im 1/44]
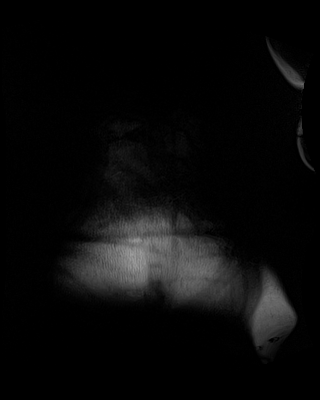
[im 15/44]
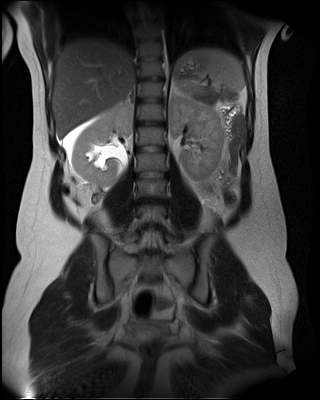
[im 29/44]
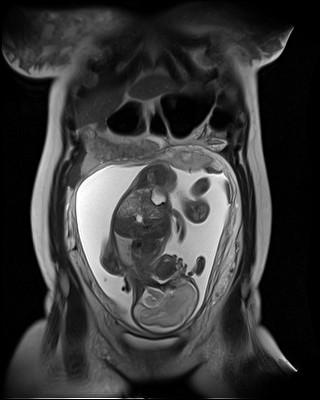
[im 44/44]
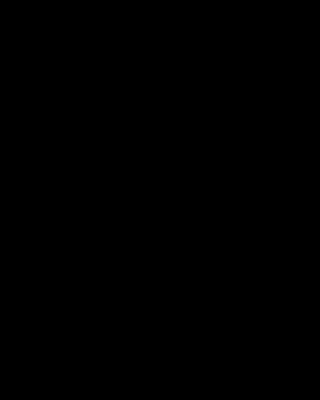

[Series 5: cor haste fs · coronal · 5.0mm · 1.25mm/px · 4 of 44 slices shown]
[im 1/44]
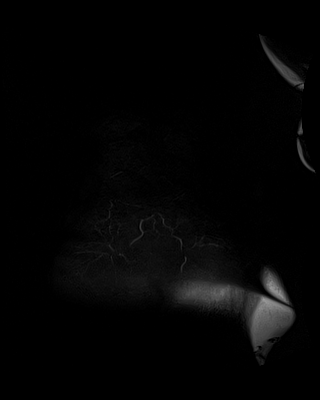
[im 15/44]
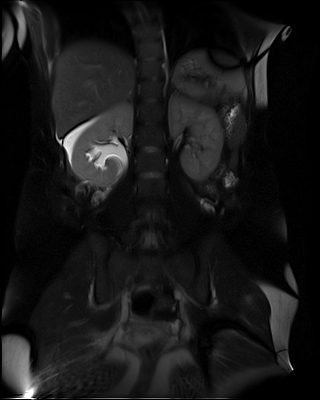
[im 29/44]
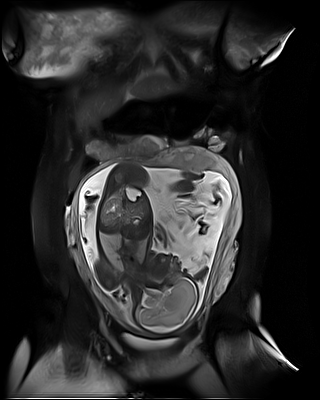
[im 44/44]
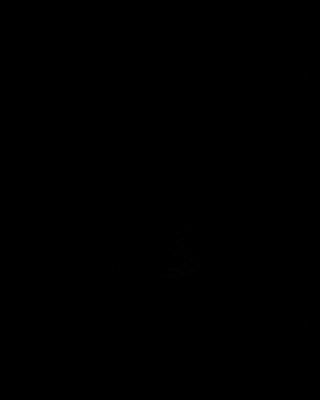

[Series 6: bSSFP · coronal · 5.0mm · 0.62mm/px · 4 of 44 slices shown (1 of 2)]
[im 1/44]
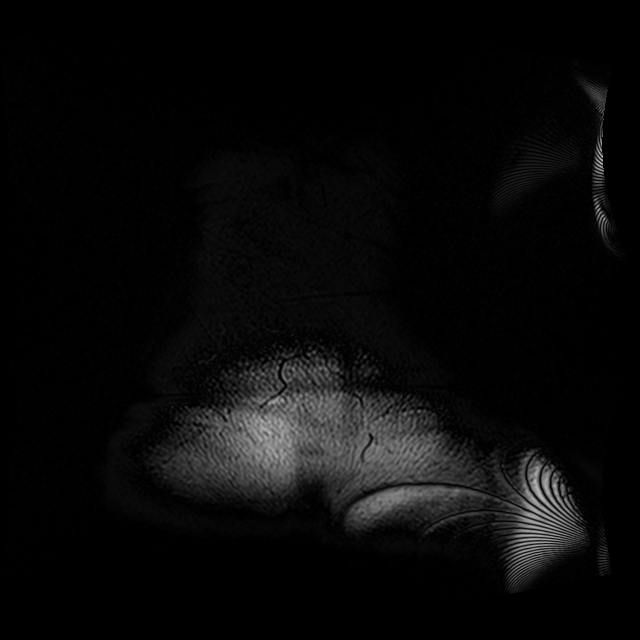
[im 15/44]
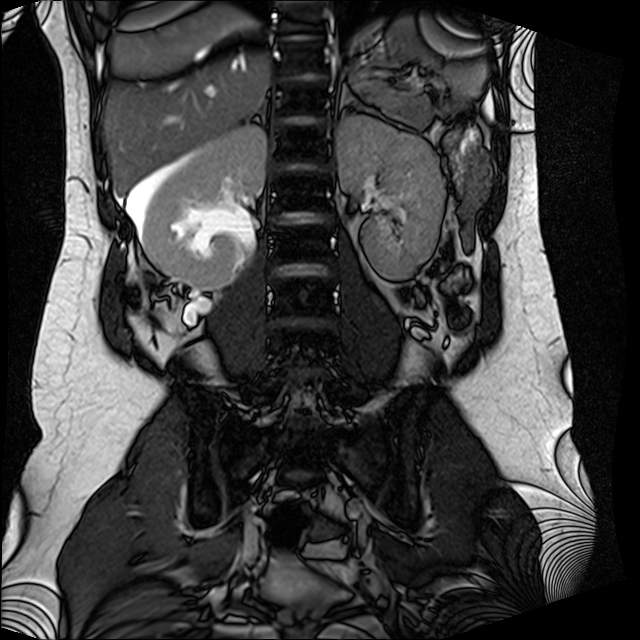
[im 29/44]
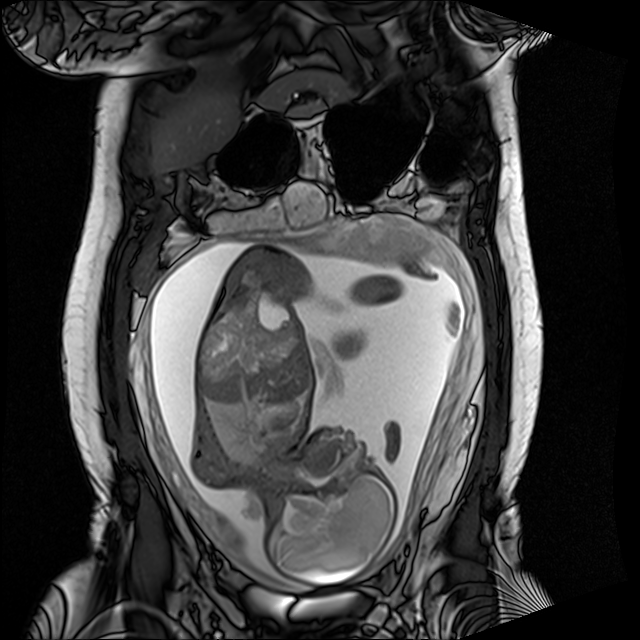
[im 44/44]
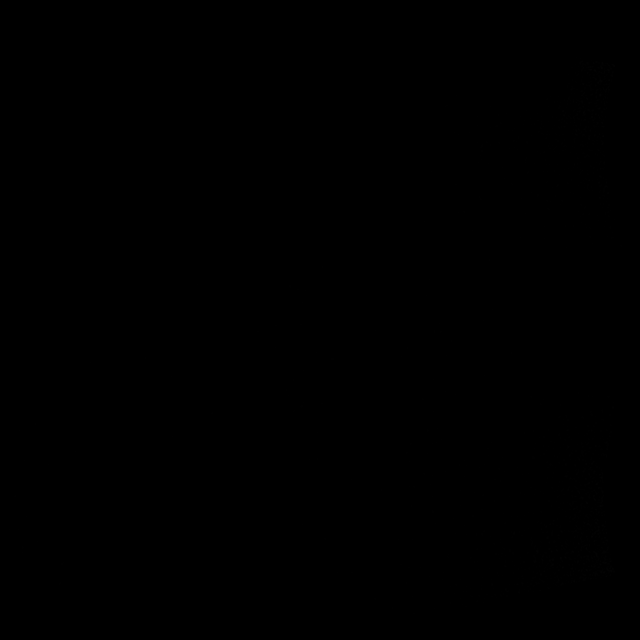

[Series 7: axial haste · axial · 5.0mm · 1.19mm/px · z∈[+43,+277]mm · 4 of 40 slices shown (1 of 2)]
[im 1/40]
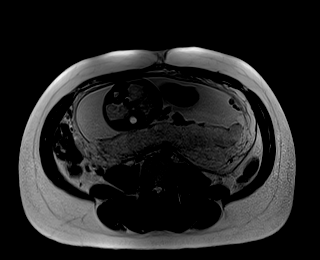
[im 14/40]
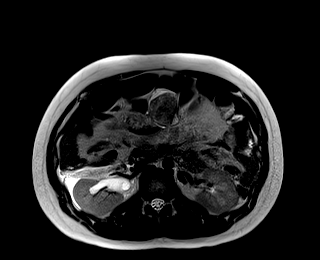
[im 27/40]
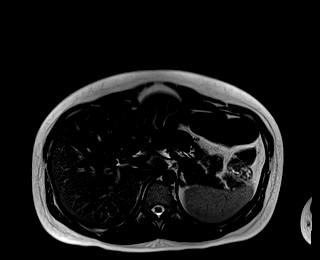
[im 40/40]
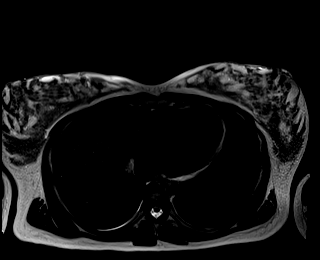

[Series 11: axial haste fs_comp · axial · 5.0mm · 1.19mm/px · z∈[-197,+277]mm · 8 of 80 slices shown]
[im 1/80]
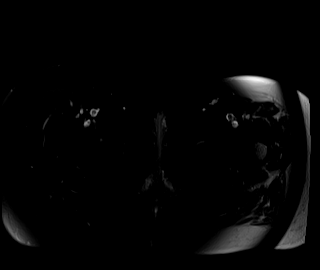
[im 12/80]
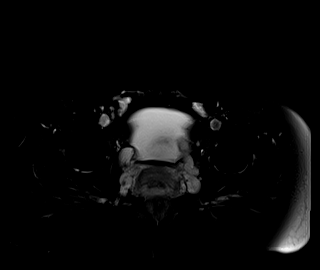
[im 23/80]
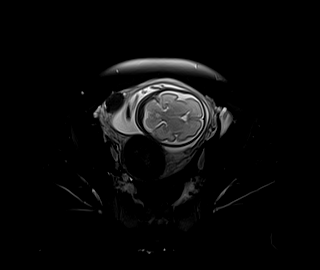
[im 34/80]
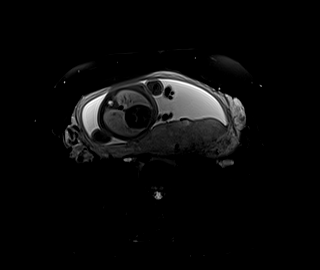
[im 46/80]
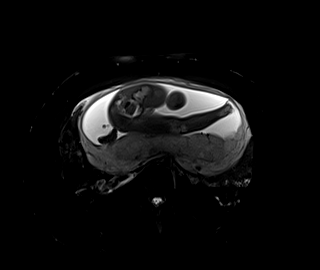
[im 57/80]
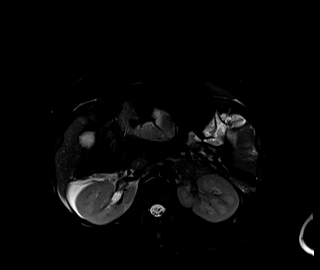
[im 68/80]
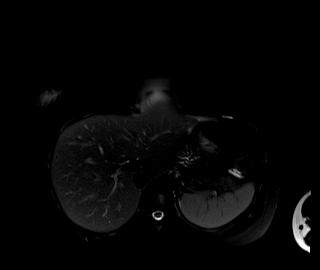
[im 80/80]
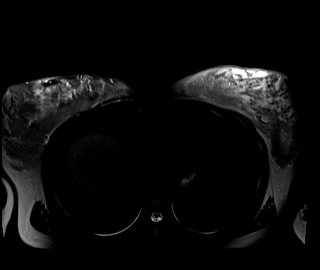

[Series 12: axial haste · axial · 5.0mm · 1.19mm/px · z∈[-197,+37]mm · 4 of 40 slices shown (2 of 2)]
[im 1/40]
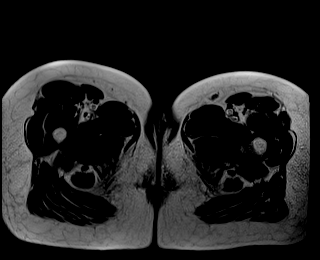
[im 14/40]
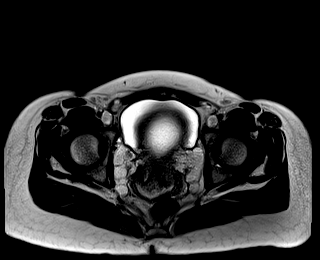
[im 27/40]
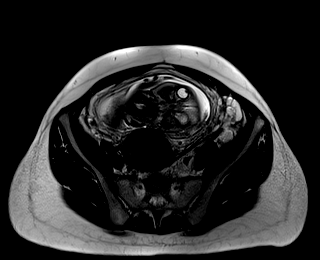
[im 40/40]
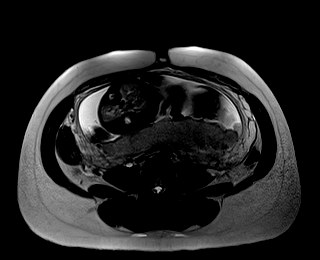

[Series 13: bSSFP · axial · 5.0mm · 0.68mm/px · z∈[-197,-41]mm · 3 of 40 slices shown (2 of 2)]
[im 1/40]
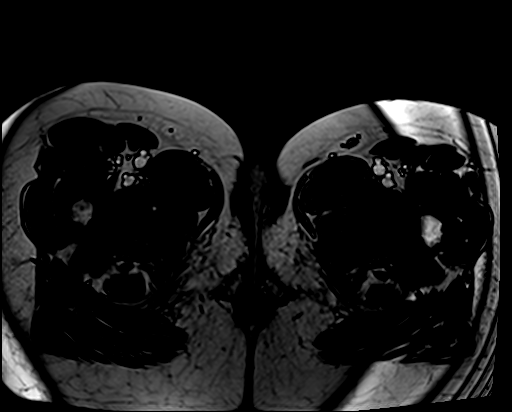
[im 14/40]
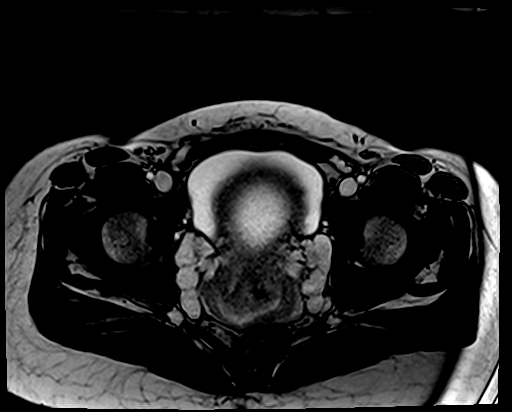
[im 27/40]
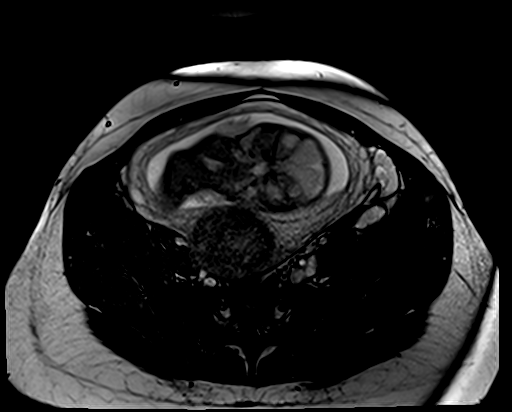

[31 of 48 positions shown; findings below may reference images not displayed]

FINDINGS: COMBINED FINDINGS FOR BOTH MR ABDOMEN AND PELVIS

Lower chest: No acute abnormality at the lung bases.

Hepatobiliary: Normal liver size and configuration. No liver mass.
Normal gallbladder with no cholelithiasis. No biliary ductal
dilatation. Common bile duct diameter 3 mm. No evidence of
choledocholithiasis.

Pancreas: No pancreatic mass or duct dilation.  No pancreas divisum.

Spleen: Normal size. No mass.

Adrenals/Urinary Tract: Normal adrenals. There is asymmetric mild to
moderate right hydroureteronephrosis to the level of upper pelvic
right ureter, with extrinsic mass effect on upper right pelvic
ureter by the dominant posterior uterine fibroid. There is a small
to moderate amount of simple right perinephric fluid. No left
hydronephrosis. No left perinephric fluid. No renal masses. Normal
bladder.

Stomach/Bowel: Normal non-distended stomach. Normal caliber small
and large bowel. No bowel wall thickening. No mesenteric or
pericolonic edema. Candidate normal appendix in the right lower
quadrant (series 4/image 24). No pericecal inflammatory changes.
Moderate transverse colonic gas.

Vascular/Lymphatic: Normal caliber abdominal aorta. No
pathologically enlarged lymph nodes in the abdomen.

Reproductive: Enlarged gravid uterus with single intrauterine
gestation in cephalic lie. The scan is not tailored for fetal
evaluation. Amniotic fluid volume is subjectively normal. Posterior
placenta without previa. Cervix length approximately 3.2 cm without
evidence of internal cervical funneling. There are multiple (at
least 4) uterine fibroids, largest 5.5 x 5.4 x 6.8 cm in posterior
right uterine body (series 11/image 56). Normal ovaries bilaterally.
No adnexal masses.

Other: No abdominal ascites or focal fluid collection.

Musculoskeletal: No aggressive appearing focal osseous lesions.
IMPRESSION: 1. Mild to moderate asymmetric right hydroureteronephrosis to the
level of the upper pelvic right ureter. Asymmetric right perinephric
simple fluid. Extrinsic mass-effect on the right ureter by the
dominant 6.8 cm posterior right uterine body fibroid. Suspect right
ureteral obstruction by the dominant right posterior uterine
fibroid.
2. No evidence of acute appendicitis.

## 2020-11-03 ENCOUNTER — Encounter: Payer: Self-pay | Admitting: Physician Assistant

## 2020-11-03 ENCOUNTER — Other Ambulatory Visit: Payer: Self-pay

## 2020-11-03 ENCOUNTER — Ambulatory Visit: Payer: Medicaid Other | Admitting: Physician Assistant

## 2020-11-03 DIAGNOSIS — N760 Acute vaginitis: Secondary | ICD-10-CM | POA: Diagnosis not present

## 2020-11-03 DIAGNOSIS — B9689 Other specified bacterial agents as the cause of diseases classified elsewhere: Secondary | ICD-10-CM

## 2020-11-03 DIAGNOSIS — Z113 Encounter for screening for infections with a predominantly sexual mode of transmission: Secondary | ICD-10-CM

## 2020-11-03 LAB — WET PREP FOR TRICH, YEAST, CLUE
Trichomonas Exam: NEGATIVE
Yeast Exam: NEGATIVE

## 2020-11-03 MED ORDER — METRONIDAZOLE 500 MG PO TABS: 500.0000 mg | ORAL_TABLET | Freq: Two times a day (BID) | ORAL | 0 refills | Status: AC

## 2020-11-03 NOTE — Progress Notes (Signed)
[facility] Department STI clinic/screening visit  Subjective:  Lydia Gallagher is a 24 y.o. female being seen today for an STI screening visit. The patient reports they do have symptoms.  Patient reports that they do not desire a pregnancy in the next year.   They reported they are not interested in discussing contraception today.  Patient's last menstrual period was 10/27/2020.   Patient has the following medical conditions:   Patient Active Problem List   Diagnosis Date Noted  . Elevated blood pressure complicating pregnancy in third trimester, antepartum 12/22/2019  . Gestational hypertension affecting first pregnancy 12/22/2019  . Kidney stone 10/10/19 ER 10/17/2019  . Right-sided back pain 10/10/2019  . Uterine fibroid complicating antenatal care, baby not yet delivered 08/30/2019  . Supervision of low-risk first pregnancy 07/05/2019  . Fibroid, uterine 07/05/2019  . Simple ovarian cyst 07/05/2019    Chief Complaint  Patient presents with  . SEXUALLY TRANSMITTED DISEASE    screening    HPI  Patient reports that she has had a vaginal odor for 1.5-2 days and a yellowish discharge.   Denies other symptoms, chronic conditions and history of surgeries.  Reports last HIV test was earlier this year and last pap was in 2020.  Reports that she is taking OCs as her BCM.  See flowsheet for further details and programmatic requirements.    The following portions of the patient's history were reviewed and updated as appropriate: allergies, current medications, past medical history, past social history, past surgical history and problem list.  Objective:  There were no vitals filed for this visit.  Physical Exam Constitutional:      General: She is not in acute distress.    Appearance: Normal appearance.  HENT:     Head: Normocephalic and atraumatic.     Comments: No nits,lice, or hair loss. No cervical, supraclavicular or axillary adenopathy.    Mouth/Throat:      Mouth: Mucous membranes are moist.     Pharynx: Oropharynx is clear. No oropharyngeal exudate or posterior oropharyngeal erythema.  Eyes:     Conjunctiva/sclera: Conjunctivae normal.  Pulmonary:     Effort: Pulmonary effort is normal.  Abdominal:     Palpations: Abdomen is soft. There is no mass.     Tenderness: There is no abdominal tenderness. There is no guarding or rebound.  Genitourinary:    General: Normal vulva.     Rectum: Normal.     Comments: External genitalia/pubic area without nits, lice, edema, erythema, lesions and inguinal adenopathy. Vagina with normal mucosa and moderate amount of thin, yellowish/white discharge, pH=>4.5. Cervix without visible lesions. Uterus firm, mobile, nt, no masses, no CMT, no adnexal tenderness or fullness. Musculoskeletal:     Cervical back: Neck supple. No tenderness.  Skin:    General: Skin is warm and dry.     Findings: No bruising, erythema, lesion or rash.  Neurological:     Mental Status: She is alert and oriented to person, place, and time.  Psychiatric:        Mood and Affect: Mood normal.        Behavior: Behavior normal.        Thought Content: Thought content normal.        Judgment: Judgment normal.      Assessment and Plan:  Lydia Gallagher is a 24 y.o. female presenting to the [facility] Department for STI screening  1. Screening for STD (sexually transmitted disease) Patient into clinic with symptoms. Patient  declines blood work today. Rec condoms with all sex. Await test results.  Counseled that RN will call if needs to RTC for treatment once results are back. - WET PREP FOR [place], YEAST, [place] [facility]  2. BV (bacterial vaginosis) Will treat for BV with Metronidazole 500 mg #14 1 po BID for 7 days with food, no EtOH for 24 hr before and until 72 hr after completing medicine. No sex for 10 days. Enc to use OTC antifungal cream if has itching during or just after antibiotic  treatment. - metroNIDAZOLE (FLAGYL) 500 MG tablet; Take 1 tablet (500 mg total) by mouth 2 (two) times daily for 7 days.  Dispense: 14 tablet; Refill: 0     No follow-ups on file.  No future appointments.  Jerene Dilling, PA

## 2020-11-09 NOTE — Progress Notes (Signed)
Chart reviewed by Pharmacist  Suzanne Walker PharmD, Contract Pharmacist at [facility]  

## 2021-01-07 ENCOUNTER — Ambulatory Visit: Payer: Medicaid Other

## 2021-04-02 ENCOUNTER — Ambulatory Visit: Payer: Medicaid Other

## 2021-04-09 ENCOUNTER — Other Ambulatory Visit: Payer: Self-pay

## 2021-04-09 ENCOUNTER — Ambulatory Visit: Payer: Medicaid Other | Admitting: Family Medicine

## 2021-04-09 ENCOUNTER — Encounter: Payer: Self-pay | Admitting: Family Medicine

## 2021-04-09 DIAGNOSIS — B9689 Other specified bacterial agents as the cause of diseases classified elsewhere: Secondary | ICD-10-CM

## 2021-04-09 DIAGNOSIS — Z113 Encounter for screening for infections with a predominantly sexual mode of transmission: Secondary | ICD-10-CM | POA: Diagnosis not present

## 2021-04-09 DIAGNOSIS — N760 Acute vaginitis: Secondary | ICD-10-CM

## 2021-04-09 LAB — WET PREP FOR TRICH, YEAST, CLUE
Trichomonas Exam: NEGATIVE
Yeast Exam: NEGATIVE

## 2021-04-09 MED ORDER — METRONIDAZOLE 500 MG PO TABS: 500.0000 mg | ORAL_TABLET | Freq: Two times a day (BID) | ORAL | 0 refills | Status: AC

## 2021-04-09 NOTE — Progress Notes (Signed)
[facility] Department STI clinic/screening visit  Subjective:  Lydia Gallagher is a 25 y.o. female being seen today for an STI screening visit. The patient reports they do have symptoms.  Patient reports that they do not desire a pregnancy in the next year.   They reported they are not interested in discussing contraception today.  Patient's last menstrual period was 03/15/2021.   Patient has the following medical conditions:   Patient Active Problem List   Diagnosis Date Noted  . Elevated blood pressure complicating pregnancy in third trimester, antepartum 12/22/2019  . Gestational hypertension affecting first pregnancy 12/22/2019  . Kidney stone 10/10/19 ER 10/17/2019  . Right-sided back pain 10/10/2019  . Uterine fibroid complicating antenatal care, baby not yet delivered 08/30/2019  . Supervision of low-risk first pregnancy 07/05/2019  . Fibroid, uterine 07/05/2019  . Simple ovarian cyst 07/05/2019    Chief Complaint  Patient presents with  . SEXUALLY TRANSMITTED DISEASE    screening    HPI  Patient reports " think I have BV"   Last HIV test per patient/review of record was 06/2021 Patient reports last pap was 05/2019  See flowsheet for further details and programmatic requirements.    The following portions of the patient's history were reviewed and updated as appropriate: allergies, current medications, past medical history, past social history, past surgical history and problem list.  Objective:  There were no vitals filed for this visit.  Physical Exam Vitals and nursing note reviewed.  Constitutional:      Appearance: Normal appearance.  HENT:     Head: Normocephalic and atraumatic.     Mouth/Throat:     Mouth: Mucous membranes are moist.     Pharynx: Oropharynx is clear. No oropharyngeal exudate or posterior oropharyngeal erythema.  Pulmonary:     Effort: Pulmonary effort is normal.  Chest:  Breasts:     Right: No axillary adenopathy or  supraclavicular adenopathy.     Left: No axillary adenopathy or supraclavicular adenopathy.    Abdominal:     General: Abdomen is flat.     Palpations: There is no mass.     Tenderness: There is no abdominal tenderness. There is no rebound.  Genitourinary:    General: Normal vulva.     Exam position: Lithotomy position.     Pubic Area: No rash or pubic lice.      Labia:        Right: No rash or lesion.        Left: No rash or lesion.      Vagina: Normal. No vaginal discharge, erythema, bleeding or lesions.     Cervix: No cervical motion tenderness, discharge, friability, lesion or erythema.     Uterus: Normal.      Adnexa: Right adnexa normal and left adnexa normal.     Rectum: Normal.     Comments: External genitalia without, lice, nits, erythema, edema , lesions or inguinal adenopathy. Vagina with normal mucosa and discharge and pH > 4.  Cervix without visual lesions, uterus firm, mobile, non-tender, no masses, CMT adnexal fullness or tenderness.  Lymphadenopathy:     Head:     Right side of head: No preauricular or posterior auricular adenopathy.     Left side of head: No preauricular or posterior auricular adenopathy.     Cervical: No cervical adenopathy.     Upper Body:     Right upper body: No supraclavicular or axillary adenopathy.     Left upper body: No supraclavicular  or axillary adenopathy.     Lower Body: No right inguinal adenopathy. No left inguinal adenopathy.  Skin:    General: Skin is warm and dry.     Findings: No rash.  Neurological:     Mental Status: She is alert and oriented to person, place, and time.  Psychiatric:        Behavior: Behavior normal.      Assessment and Plan:  Lydia Gallagher is a 25 y.o. female presenting to the [facility] Department for STI screening  1. Screening examination for venereal disease  - Chlamydia/Gonorrhea [place] [facility] - WET PREP FOR [place], YEAST, CLUE  2. BV (bacterial vaginosis)  Patient accepted all  screenings including wet prep, vaginal CT/GC and declines bloodwork for HIV/RPR.  Patient meets criteria for HepB screening? No. Ordered? No - declines  Patient meets criteria for HepC screening? No. Ordered? No - declines   Wet prep results negative, will treat based on provider assessment.    RN: Treatment needed for BV  With metronidazole 500 MG PO BID x 7 days  Discussed time line for State Lab results and that patient will be called with positive results and encouraged patient to call if she had not heard in 2 weeks.  Counseled to return or seek care for continued or worsening symptoms Recommended condom use with all sex  Patient is currently using Hormonal Contraception: Injection, Rings and Patches to prevent pregnancy.     Return for as needed.  No future appointments.  Junious Dresser, FNP

## 2021-04-09 NOTE — Progress Notes (Addendum)
Pt here for STD screening.  Pt declines blood work.  Wet mount reviewed with Provider.  Pt treated per Provider order.   Provider orders completed Pt will call and schedule a PE in the next couple of weeks. Windle Guard, RN

## 2022-01-05 ENCOUNTER — Encounter: Payer: Self-pay | Admitting: Family Medicine

## 2022-01-05 ENCOUNTER — Ambulatory Visit: Payer: Medicaid Other | Admitting: Family Medicine

## 2022-01-05 ENCOUNTER — Other Ambulatory Visit: Payer: Self-pay

## 2022-01-05 DIAGNOSIS — Z113 Encounter for screening for infections with a predominantly sexual mode of transmission: Secondary | ICD-10-CM

## 2022-01-05 LAB — WET PREP FOR TRICH, YEAST, CLUE
Trichomonas Exam: NEGATIVE
Yeast Exam: NEGATIVE

## 2022-01-05 NOTE — Progress Notes (Signed)
[facility] Department  STI clinic/screening visit [place] [address] [phone]  Subjective:  Lydia Gallagher is a 26 y.o. female being seen today for an STI screening visit. The patient reports they do have symptoms.  Patient reports that they do not desire a pregnancy in the next year.   They reported they are not interested in discussing contraception today.    Patient's last menstrual period was 12/06/2021 (approximate).   Patient has the following medical conditions:   Patient Active Problem List   Diagnosis Date Noted   History of gestational hypertension 12/22/2019   Kidney stone 10/10/19 ER 10/17/2019   Right-sided back pain 10/10/2019   Fibroid, uterine 07/05/2019   Simple ovarian cyst 07/05/2019    Chief Complaint  Patient presents with   SEXUALLY TRANSMITTED DISEASE    screening    HPI  Patient reports reports vaginal odor. Denies discharge. Denies itching.  Partners in last 7 days- 1 partner Condoms always Female partners only On the patch for pregnancy prevention- happy with method.   Last HIV test per patient/review of record was 10/2020 Patient reports last pap was never had-- needs screening. Encouraged FP appt.   Screening for MPX risk: Does the patient have an unexplained rash? No Is the patient MSM? No Does the patient endorse multiple sex partners or anonymous sex partners? No Did the patient have close or sexual contact with a person diagnosed with MPX? No Has the patient traveled outside the [place] where MPX is endemic? No Is there a high clinical suspicion for MPX-- evidenced by one of the following No  -Unlikely to be chickenpox  -Lymphadenopathy  -Rash that present in same phase of evolution on any given body part See flowsheet for further details and programmatic requirements.    The following portions of the patient's history were reviewed and updated as appropriate: allergies, current medications,  past medical history, past social history, past surgical history and problem list.  Objective:  There were no vitals filed for this visit.  Physical Exam Vitals and nursing note reviewed.  Constitutional:      Appearance: Normal appearance.  HENT:     Head: Normocephalic and atraumatic.     Mouth/Throat:     Mouth: Mucous membranes are moist.     Pharynx: Oropharynx is clear. No oropharyngeal exudate or posterior oropharyngeal erythema.  Pulmonary:     Effort: Pulmonary effort is normal.  Abdominal:     General: Abdomen is flat.     Palpations: There is no mass.     Tenderness: There is no abdominal tenderness. There is no rebound.  Genitourinary:    General: Normal vulva.     Exam position: Lithotomy position.     Pubic Area: No rash or pubic lice.      Labia:        Right: No rash or lesion.        Left: No rash or lesion.      Vagina: Normal. No vaginal discharge (white, thin. mpH >4.5), erythema, bleeding or lesions.     Cervix: No cervical motion tenderness, discharge, friability, lesion or erythema.     Uterus: Normal.      Adnexa: Right adnexa normal and left adnexa normal.     Rectum: Normal.  Lymphadenopathy:     Head:     Right side of head: No preauricular or posterior auricular adenopathy.     Left side of head: No preauricular or posterior auricular  adenopathy.     Cervical: No cervical adenopathy.     Upper Body:     Right upper body: No supraclavicular or axillary adenopathy.     Left upper body: No supraclavicular or axillary adenopathy.     Lower Body: No right inguinal adenopathy. No left inguinal adenopathy.  Skin:    General: Skin is warm and dry.     Findings: No rash.  Neurological:     Mental Status: She is alert and oriented to person, place, and time.     Assessment and Plan:  Lydia Gallagher is a 26 y.o. female presenting to the [facility] Department for STI screening  1. Screening for STD (sexually transmitted  disease) Patient accepted all screenings including vaginal CT/GC and bloodwork for HIV/RPR.  Patient meets criteria for HepB screening? No. Ordered? No - NA Patient meets criteria for HepC screening? No. Ordered? No - NA  Treat wet prep per standing order Discussed time line for State Lab results and that patient will be called with positive results and encouraged patient to call if she had not heard in 2 weeks.  Counseled to return or seek care for continued or worsening symptoms Recommended condom use with all sex  Patient is currently using Hormonal Contraception: Injection, Rings and Patches to prevent pregnancy.   - WET PREP FOR TRICH, YEAST, CLUE - Syphilis Serology, [place] [facility]     No follow-ups on file.  No future appointments.  Caren Macadam, MD

## 2022-01-05 NOTE — Progress Notes (Signed)
Pt states she has BV Sx.

## 2022-01-05 NOTE — Progress Notes (Signed)
Pt here for STD screening.  Wet mount results reviewed, no treatment required per SO.  Pt declined condoms. Windle Guard, RN

## 2022-01-05 NOTE — Patient Instructions (Signed)
Natural Remedies for Bacterial Vaginosis  Option #1 1 Tbsp Fractitionated Coconut Oil 10 drops of Melaleuca (Tea Tree) Oil  Mix ingredients together well.  Soak 3-4 tampons (in applicators) in that mixture until all or mostly all mixture is soaked up into the tampons.  Insert 1 saturated tampon vaginally and wear overnight for 3-4 nights.    Option #2 (sometimes to be used in conjunction with option #1) Fill tub with enough to cover lap/lower abdomen warm water.  Mix 1/2 cup of baking soda in water.  Soak in water/baking soda mixture for at least 20 minutes.  Be sure to swish water in between legs to get as much in vagina as possible.  This soak should be done after sexual intercourse and menstrual cycles.     Option #3 (sometimes to be used in conjunction with option #1 and 2) Fill tub with enough to cover lap/lower abdomen warm water.  Mix 2-4 cup of apple cider vinegar in water.  Soak in water/vinegar mixture for at least 20 minutes.  Be sure to swish water in between legs to get as much in vagina as possible.  This soak should be done after sexual intercourse and menstrual cycles.    GO WHITE: Soap: UNSCENTED Dove (white box light green writing) Laundry detergent (underwear)- Dreft or [employer] unscented WHITE 100% cotton panties (NOT just cotton crouch) Sanitary napkin/panty liners: UNSCENTED.  If it doesn't SAY unscented it can have a scent/perfume    NO PERFUMES OR LOTIONS OR POTIONS in the vulvar area (may use regular [place]) Condoms: hypoallergenic only. Non dyed (no color) Toilet papers: white only Wash clothes: use a separate wash cloth. WHITE.  Wash in [place].   You can purchase Tea Tree Oil locally at:  [employer] [address] [place] [address]  [employer] [address] [place] [address]  Advise that these alternatives will not replace the need to be evaluated if symptoms persist. You will need to seek care at an OB/GYN provider.

## 2022-03-03 ENCOUNTER — Ambulatory Visit (LOCAL_COMMUNITY_HEALTH_CENTER): Payer: Medicaid Other | Admitting: Advanced Practice Midwife

## 2022-03-03 ENCOUNTER — Encounter: Payer: Self-pay | Admitting: Advanced Practice Midwife

## 2022-03-03 ENCOUNTER — Other Ambulatory Visit: Payer: Self-pay

## 2022-03-03 VITALS — BP 127/89 | Ht 64.0 in | Wt 203.2 lb

## 2022-03-03 DIAGNOSIS — E669 Obesity, unspecified: Secondary | ICD-10-CM | POA: Insufficient documentation

## 2022-03-03 DIAGNOSIS — Z309 Encounter for contraceptive management, unspecified: Secondary | ICD-10-CM | POA: Diagnosis not present

## 2022-03-03 DIAGNOSIS — Z3045 Encounter for surveillance of transdermal patch hormonal contraceptive device: Secondary | ICD-10-CM | POA: Diagnosis not present

## 2022-03-03 DIAGNOSIS — F17200 Nicotine dependence, unspecified, uncomplicated: Secondary | ICD-10-CM | POA: Diagnosis not present

## 2022-03-03 DIAGNOSIS — Z3202 Encounter for pregnancy test, result negative: Secondary | ICD-10-CM | POA: Diagnosis not present

## 2022-03-03 DIAGNOSIS — Z3009 Encounter for other general counseling and advice on contraception: Secondary | ICD-10-CM

## 2022-03-03 LAB — HM HIV SCREENING LAB: HM HIV Screening: NEGATIVE

## 2022-03-03 LAB — WET PREP FOR TRICH, YEAST, CLUE
Trichomonas Exam: NEGATIVE
Yeast Exam: NEGATIVE

## 2022-03-03 LAB — PREGNANCY, URINE: Preg Test, Ur: NEGATIVE

## 2022-03-03 MED ORDER — XULANE 150-35 MCG/24HR TD PTWK: 1.0000 | MEDICATED_PATCH | TRANSDERMAL | 12 refills | Status: DC

## 2022-03-03 MED ORDER — LEVONORGESTREL 1.5 MG PO TABS: 1.5000 mg | ORAL_TABLET | Freq: Once | ORAL | 0 refills | Status: AC

## 2022-03-03 NOTE — Progress Notes (Signed)
Wet mount reviewed, no treatment indicated. PT negative. Patient given Plan B per provider orders. Plans to review IUD pamphlets and call if she wants to change her BCM method.Jenetta Downer, RN  ?

## 2022-03-03 NOTE — Progress Notes (Signed)
[place] [facility] ?[place] ?Main Number: [phone] ? ? ? ?Family Planning Visit- Initial Visit ? ?Subjective:  ?Lydia Gallagher is a 26 y.o. SBF nonsmoker  G1P1001 (83 yo daughter)  being seen today for an initial annual visit and to discuss reproductive life planning.  The patient is currently using No Method - Other Reason for pregnancy prevention. Patient reports   does not want a pregnancy in the next year.  Patient has the following medical conditions has Fibroid, uterine; Simple ovarian cyst; Right-sided back pain; Kidney stone 10/10/19 ER; History of gestational hypertension; and Obesity BMI=34.8 on their problem list. ? ?Chief Complaint  ?Patient presents with  ? Annual Exam  ? ? ?Patient reports here for physical, pap, more patches, Plan B. Took off last patch 02/27/22. LMP 02/09/22. Last sex 02/26/22 without condom; with current partner x 3 years; 1 partner in last 3 mo. Last cigar age 64 yo. Last MJ age 27. Last ETOH 12/2021 (3 glasses wine+3 shots liquor) 2x/mo. Last dental exam 2022. Not working. Not in school. Living with her mom and her husband, and pt's 84 yo daughter. Wants Plan B and more patches.  ? ?Patient denies cigs, vaping  ? ?Body mass index is 34.88 kg/m?. - Patient is eligible for diabetes screening based on BMI and age >41?  not applicable ?HA1C ordered? not applicable ? ?Patient reports 2  partner/s in last year. Desires STI screening?  Yes ? ?Has patient been screened once for HCV in the past?  No ? No results found for: HCVAB ? ?Does the patient have current drug use (including MJ), have a partner with drug use, and/or has been incarcerated since last result? No  ?If yes-- Screen for HCV through [place] ?  ?Does the patient meet criteria for HBV testing? No ? ?Criteria:  ?-Household, sexual or needle sharing contact with HBV ?-History of drug use ?-HIV positive ?-Those with known Hep C ? ? ?Health Maintenance Due  ?Topic Date  Due  ? Hepatitis C Screening  Never done  ? HPV VACCINES (2 - 3-dose series) 12/23/2016  ? COVID-19 Vaccine (2 - Moderna series) 11/18/2020  ? INFLUENZA VACCINE  Never done  ? ? ?Review of Systems  ?Constitutional:  Positive for weight loss (50 lb wt gain in past 2 years, not exercising, suggestions given).  ?Neurological:  Positive for headaches (qo day always on forehead relieved spontaneously,-N&V,-vision,-auditory,-neuro).  ?All other systems reviewed and are negative. ? ?The following portions of the patient's history were reviewed and updated as appropriate: allergies, current medications, past family history, past medical history, past social history, past surgical history and problem list. Problem list updated. ? ? ?See flowsheet for other program required questions. ? ?Objective:  ? ?Vitals:  ? 03/03/22 0918  ?BP: 127/89  ?Weight: 203 lb 3.2 oz (92.2 kg)  ?Height: '5\' 4"'$  (1.626 m)  ? ? ?Physical Exam ?Constitutional:   ?   Appearance: Normal appearance. She is obese.  ?HENT:  ?   Head: Normocephalic and atraumatic.  ?   Mouth/Throat:  ?   Mouth: Mucous membranes are moist.  ?   Comments: Last dental exam 2022 ?Eyes:  ?   Conjunctiva/sclera: Conjunctivae normal.  ?Neck:  ?   Thyroid: No thyroid mass, thyromegaly or thyroid tenderness.  ?Cardiovascular:  ?   Rate and Rhythm: Normal rate and regular rhythm.  ?Pulmonary:  ?   Effort: Pulmonary effort is normal.  ?  Breath sounds: Normal breath sounds.  ?Chest:  ?Breasts: ?   Right: Normal.  ?   Left: Normal.  ?Abdominal:  ?   Palpations: Abdomen is soft.  ?   Comments: Soft without masses or tenderness, fair tone, increased adipose  ?Genitourinary: ?   General: Normal vulva.  ?   Exam position: Lithotomy position.  ?   Vagina: Vaginal discharge (grey malodorous leukorrhea, ph>4.5) present.  ?   Cervix: Friability (sl friable to pap) present.  ?   Uterus: Normal.   ?   Adnexa: Right adnexa normal and left adnexa normal.  ?   Rectum: Normal.  ?   Comments: Pap  done ?Musculoskeletal:     ?   General: Normal range of motion.  ?   Cervical back: Normal range of motion and neck supple.  ?Skin: ?   General: Skin is warm and dry.  ?Neurological:  ?   Mental Status: She is alert.  ?Psychiatric:     ?   Mood and Affect: Mood normal.  ? ? ? ? ?Assessment and Plan:  ?Lydia Gallagher is a 26 y.o. female presenting to the [facility] Department for an initial annual wellness/contraceptive visit ? ?Contraception counseling: Reviewed all forms of birth control options in the tiered based approach. available including abstinence; over the counter/barrier methods; hormonal contraceptive medication including pill, patch, ring, injection,contraceptive implant, ECP; hormonal and nonhormonal IUDs; permanent sterilization options including vasectomy and the various tubal sterilization modalities. Risks, benefits, and typical effectiveness rates were reviewed.  Questions were answered.  Written information was also given to the patient to review.  Patient desires Contraceptive Patch, this was prescribed for patient.  ? ? ?The patient will follow up in  1 year for surveillance.  The patient was told to call with any further questions, or with any concerns about this method of contraception.  Emphasized use of condoms 100% of the time for STI prevention. ? ?Patient was offered ECP based on Unprotected sex within past 120 hours. ECP was accepted by the patient. ECP counseling was not given - see RN documentation ? ?1. Obesity, unspecified classification, unspecified obesity type, unspecified whether serious comorbidity present ? ? ?2. Family planning ?If PT neg today may have Plan B today ?Please give [employer] brochure to pt ?Treat wet mount per standing orders ?Immunization nurse consult ? ?- WET PREP FOR TRICH, YEAST, CLUE ?- Pregnancy, urine ?- Chlamydia/Gonorrhea [place] [facility] ?- HIV [place] [facility] ?- Syphilis Serology, [place] [facility] ?- Pap IG (Image Guided) ? ?3. Encounter for  surveillance of transdermal patch hormonal contraceptive device ?Xulane patch e-rx ?- norelgestromin-ethinyl estradiol Marilu Favre) 150-35 MCG/24HR transdermal patch; Place 1 patch onto the skin once a week.  Dispense: 3 patch; Refill: 12 ? ? ? ? ?No follow-ups on file. ? ?No future appointments. ? ?Herbie Saxon, CNM ?

## 2022-03-05 LAB — PAP IG (IMAGE GUIDED): PAP Smear Comment: 0

## 2023-03-30 ENCOUNTER — Telehealth: Payer: Self-pay | Admitting: Advanced Practice Midwife

## 2023-03-30 ENCOUNTER — Other Ambulatory Visit: Payer: Self-pay

## 2023-03-30 DIAGNOSIS — Z3045 Encounter for surveillance of transdermal patch hormonal contraceptive device: Secondary | ICD-10-CM

## 2023-03-30 MED ORDER — NORELGESTROMIN-ETH ESTRADIOL 150-35 MCG/24HR TD PTWK: 1.0000 | MEDICATED_PATCH | TRANSDERMAL | 0 refills | Status: DC

## 2023-03-30 NOTE — Telephone Encounter (Signed)
Pt notified that 1 refill for her Lydia Gallagher patches has been called in, however, she will need to make an appointment for additional refills.  Pt verbalizes understanding.

## 2023-03-30 NOTE — Telephone Encounter (Signed)
Requesting birth control refill

## 2023-04-08 ENCOUNTER — Encounter: Payer: Self-pay | Admitting: Advanced Practice Midwife

## 2023-04-08 ENCOUNTER — Ambulatory Visit (LOCAL_COMMUNITY_HEALTH_CENTER): Payer: Medicaid Other | Admitting: Advanced Practice Midwife

## 2023-04-08 VITALS — BP 133/89 | HR 86 | Ht 64.0 in | Wt 204.0 lb

## 2023-04-08 DIAGNOSIS — Z309 Encounter for contraceptive management, unspecified: Secondary | ICD-10-CM | POA: Diagnosis not present

## 2023-04-08 DIAGNOSIS — Z3045 Encounter for surveillance of transdermal patch hormonal contraceptive device: Secondary | ICD-10-CM

## 2023-04-08 DIAGNOSIS — Z3009 Encounter for other general counseling and advice on contraception: Secondary | ICD-10-CM

## 2023-04-08 LAB — WET PREP FOR TRICH, YEAST, CLUE
Trichomonas Exam: NEGATIVE
Yeast Exam: NEGATIVE

## 2023-04-08 MED ORDER — NORELGESTROMIN-ETH ESTRADIOL 150-35 MCG/24HR TD PTWK: 1.0000 | MEDICATED_PATCH | TRANSDERMAL | 12 refills | Status: DC

## 2023-04-08 NOTE — Progress Notes (Signed)
[facility] DEPARTMENT [facility] [address] Main Number: [phone]    Family Planning Visit- Initial Visit  Subjective:  Lydia Gallagher is a 27 y.o. SBF exsmoker G2P1011 (3 yo daughter)  being seen today for an initial annual visit and to discuss reproductive life planning.  The patient is currently using Contraceptive Patch for pregnancy prevention. Patient reports   does not want a pregnancy in the next year.     report they are looking for a method that provides High efficacy at preventing pregnancy  Patient has the following medical conditions has Fibroid, uterine; Kidney stone 10/10/19 ER; History of gestational hypertension; and Obesity BMI=34.8 on their problem list.  Chief Complaint  Patient presents with   Annual Exam    Patient reports here for physical and more patches. Happy with patches but c/o h/a 5-6x/wk relieved with Tylenol in different places on head, -N&V, -audio, -vision, -neuro; suggestions given. BP=133/89, repeat 119/80.  Not working. Living with daughter. LMP 03/25/23. Last sex 03/21/23 with condom; with current partner x 4 years; 2 partners in last 3 mo. Last cigar in high school. Last MJ 12/2022. Last ETOH 03/21/23 (2 wine coolers). Last dental exam 2020. Last PE 03/03/22. Last pap 03/03/22 neg.   Patient denies cigs, vaping  Body mass index is 35.02 kg/m. - Patient is eligible for diabetes screening based on BMI> 25 and age >35?  not applicable HA1C ordered? not applicable  Patient reports 2  partner/s in last year. Desires STI screening?  No - declines bloodwork  Has patient been screened once for HCV in the past?  No  No results found for: "HCVAB"  Does the patient have current drug use (including MJ), have a partner with drug use, and/or has been incarcerated since last result? Yes  If yes-- Screen for HCV through [facility]   Does the patient meet criteria for HBV testing? No  Criteria:  -Household, sexual  or needle sharing contact with HBV -History of drug use -HIV positive -Those with known Hep C   Health Maintenance Due  Topic Date Due   Hepatitis C Screening  Never done   HPV VACCINES (2 - 3-dose series) 12/23/2016   COVID-19 Vaccine (2 - 2023-24 season) 08/27/2022    Review of Systems  Constitutional:  Positive for weight loss (50 lb wt gain in last 3 years; not exercising; suggestions given).  Neurological:  Positive for headaches (5-6x/wk relieved with Tylenol in different places on head; -N&V,-vision,-audio,-neuro; suggestions given).  All other systems reviewed and are negative.   The following portions of the patient's history were reviewed and updated as appropriate: allergies, current medications, past family history, past medical history, past social history, past surgical history and problem list. Problem list updated.   See flowsheet for other program required questions.  Objective:   Vitals:   04/08/23 1500  BP: 133/89  Pulse: 86  Weight: 204 lb (92.5 kg)  Height: 5\' 4"  (1.626 m)    Physical Exam Constitutional:      Appearance: Normal appearance. She is obese.  HENT:     Head: Normocephalic and atraumatic.     Mouth/Throat:     Mouth: Mucous membranes are moist.     Comments: Last dental exam 2020 Eyes:     Conjunctiva/sclera: Conjunctivae normal.  Cardiovascular:     Rate and Rhythm: Normal rate and regular rhythm.  Pulmonary:     Effort: Pulmonary effort is normal.  Breath sounds: Normal breath sounds.  Abdominal:     Palpations: Abdomen is soft.     Comments: Soft without masses or tenderness, fair tone  Genitourinary:    Rectum: Normal.     Comments: Pt declines pelvic exam and prefers to self collect Musculoskeletal:        General: Normal range of motion.     Cervical back: Normal range of motion and neck supple.  Skin:    General: Skin is warm and dry.  Neurological:     Mental Status: She is alert.  Psychiatric:        Mood and  Affect: Mood normal.       Assessment and Plan:  Lydia Gallagher is a 27 y.o. female presenting to the [facility] Department for an initial annual wellness/contraceptive visit  Contraception counseling: Reviewed options based on patient desire and reproductive life plan. Patient is interested in Contraceptive Patch. This was provided to the patient today.  if not why not clearly documented  Risks, benefits, and typical effectiveness rates were reviewed.  Questions were answered.  Written information was also given to the patient to review.    The patient will follow up in  1 years for surveillance.  The patient was told to call with any further questions, or with any concerns about this method of contraception.  Emphasized use of condoms 100% of the time for STI prevention.  Need for ECP was assessed. Patient reported not meeting criteria.  Reviewed options and patient desired No method of ECP, declined all    1. Family planning Treat wet mount per standing orders Immunization nurse consult  - norelgestromin-ethinyl estradiol Burr Medico) 150-35 MCG/24HR transdermal patch; Place 1 patch onto the skin once a week.  Dispense: 3 patch; Refill: 12 - WET PREP FOR TRICH, YEAST, CLUE - Chlamydia/Gonorrhea [place] [facility]  2. Encounter for surveillance of transdermal patch hormonal contraceptive device E-Rx Xulane patches with enough RF x 1 year     Return in about 1 year (around 04/07/2024) for yearly physical exam.  No future appointments.  Alberteen Spindle, CNM

## 2023-04-08 NOTE — Progress Notes (Signed)
Repeat BP after 5 minutes of sitting = 119/80. Jossie Ng, RN Denies vaginal symptoms and self-collected gonorrhea / chlamydia / wet prep specimens. Wet prep reviewed by Hazle Coca CNM and no intervention required. Client counseled that would later this evening before e-prescription for patches was sent to the pharmacy. Jossie Ng, RN

## 2023-10-11 ENCOUNTER — Ambulatory Visit: Payer: Medicaid Other | Admitting: Family Medicine

## 2023-10-11 ENCOUNTER — Encounter: Payer: Self-pay | Admitting: Family Medicine

## 2023-10-11 ENCOUNTER — Other Ambulatory Visit: Payer: Self-pay

## 2023-10-11 DIAGNOSIS — Z113 Encounter for screening for infections with a predominantly sexual mode of transmission: Secondary | ICD-10-CM | POA: Diagnosis not present

## 2023-10-11 DIAGNOSIS — N760 Acute vaginitis: Secondary | ICD-10-CM

## 2023-10-11 LAB — WET PREP FOR TRICH, YEAST, CLUE: Trichomonas Exam: NEGATIVE

## 2023-10-11 LAB — HM HIV SCREENING LAB: HM HIV Screening: NEGATIVE

## 2023-10-11 MED ORDER — METRONIDAZOLE 500 MG PO TABS
500.0000 mg | ORAL_TABLET | Freq: Two times a day (BID) | ORAL | Status: AC
Start: 2023-10-11 — End: 2023-10-18

## 2023-10-11 NOTE — Progress Notes (Signed)
PT is here for std screening.  Condoms declined. The patient was dispensed metronidazole today. I provided counseling today regarding the medication. We discussed the medication, the side effects and when to call clinic. Patient given the opportunity to ask questions. Questions answered.  Gaspar Garbe, RN

## 2023-10-11 NOTE — Progress Notes (Signed)
[facility] Department  STI clinic/screening visit [address] [place] [address] [phone]  Subjective:  Lydia Gallagher is a pleasant 27 y.o. female being seen today for an STI screening visit. The patient reports they do not have symptoms.  Patient reports that they do not desire a pregnancy in the next year. They reported they are not interested in discussing contraception today.    Patient's last menstrual period was 09/27/2023 (exact date).  Patient has the following medical conditions:   Patient Active Problem List   Diagnosis Date Noted   Obesity BMI=34.8 03/03/2022   History of gestational hypertension 12/22/2019   Kidney stone 10/10/19 ER 10/17/2019   Fibroid, uterine 07/05/2019    Chief Complaint  Patient presents with   SEXUALLY TRANSMITTED DISEASE    No symptoms    Patient presents today requesting asymptomatic STI testing. She reports condom use always with female partners and only uses oral contraception when she is sexually active. Patient reports oral and vaginal intercourse. Patient has history of STI (Chlamydia in 2021-3 years prior to today's visit).    Patient reports no symptoms today.   Does the patient using douching products? No  Last HIV test per patient/review of record was  Lab Results  Component Value Date   HMHIVSCREEN Negative - Validated 03/03/2022    Lab Results  Component Value Date   HIV Non reactive 10/08/2019     Last HEPC test per patient/review of record was not found.   Last HEPB test per patient/review of record was not found.     Patient reports last pap was  03/03/22: NILM, HPV reflex not indicated. 06/22/19: NILM, HPV reflex not indicated.  Lab Results  Component Value Date   SPECADGYN Comment 03/03/2022    Screening for MPX risk: Does the patient have an unexplained rash? No Is the patient MSM? No Does the patient endorse multiple sex partners or anonymous sex partners? No Did the patient  have close or sexual contact with a person diagnosed with MPX? No Has the patient traveled outside the [place] where MPX is endemic? No Is there a high clinical suspicion for MPX-- evidenced by one of the following No  -Unlikely to be chickenpox  -Lymphadenopathy  -Rash that present in same phase of evolution on any given body part See flowsheet for further details and programmatic requirements.   Immunization history:  Immunization History  Administered Date(s) Administered   Hepatitis A 04/09/2008, 11/25/2016   Hepatitis B [birth year]Nov[birth year]1997, 05/20/1997, 09/19/1997   Hpv-Unspecified 11/25/2016   Moderna Sars-Covid-2 Vaccination 10/21/2020   Tdap 10/08/2019     The following portions of the patient's history were reviewed and updated as appropriate: allergies, current medications, past medical history, past social history, past surgical history and problem list.  Objective:  There were no vitals filed for this visit.  Physical Exam Nursing note reviewed. Exam conducted with a chaperone present Cameron Proud, RN chaperone present for CBE.).  Constitutional:      Appearance: Normal appearance.  HENT:     Head: Normocephalic.     Salivary Glands: Right salivary gland is not diffusely enlarged or tender. Left salivary gland is not diffusely enlarged or tender.     Mouth/Throat:     Mouth: Mucous membranes are moist.     Pharynx: Oropharynx is clear. No oropharyngeal exudate.  Eyes:     General:        Right eye: No discharge.        Left  eye: No discharge.  Pulmonary:     Effort: Pulmonary effort is normal.  Chest:  Breasts:    Tanner Score is 5.     Breasts are symmetrical.     Right: Normal. No inverted nipple, mass, nipple discharge, skin change or tenderness.     Left: Normal. No inverted nipple, mass, nipple discharge, skin change or tenderness.  Genitourinary:    Comments: Declined genital exam- no symptoms, self swabbed Musculoskeletal:        General: Normal range of motion.   Lymphadenopathy:     Head:     Right side of head: No submental, submandibular, tonsillar, preauricular or posterior auricular adenopathy.     Left side of head: No submental, submandibular, tonsillar, preauricular or posterior auricular adenopathy.     Cervical: No cervical adenopathy.     Right cervical: No superficial or posterior cervical adenopathy.    Left cervical: No superficial or posterior cervical adenopathy.     Upper Body:     Right upper body: No supraclavicular or axillary adenopathy.     Left upper body: No supraclavicular or axillary adenopathy.  Skin:    General: Skin is warm and dry.     Comments: Exposed areas only. Skin tone appropriate for ethnicity.   Neurological:     Mental Status: She is alert and oriented to person, place, and time.  Psychiatric:        Attention and Perception: Attention normal.        Mood and Affect: Mood and affect normal.        Speech: Speech normal.        Behavior: Behavior normal. Behavior is cooperative.        Thought Content: Thought content normal.    Assessment and Plan:  Cheyrl N Paske is a 27 y.o. female presenting to the [facility] Department for STI screening  1. Screening for STD (sexually transmitted disease) Patient requested STI screening. Gonococcus culture performed at patient request d/t possible exposure with oral sex.  - Chlamydia/Gonorrhea [place] [facility] - HIV [place] [facility] - Syphilis Serology, [place] [facility] - Gonococcus culture - WET PREP FOR TRICH, YEAST, CLUE  2. Bacterial vaginosis Clue cells and amine present on wet prep. Treated per SO by RN. See below.  - metroNIDAZOLE (FLAGYL) 500 MG tablet; Take 1 tablet (500 mg total) by mouth 2 (two) times daily for 7 days.  Patient accepted all screenings including oral, vaginal CT/GC and bloodwork for HIV/RPR, and wet prep. Patient meets criteria for HepB screening? No. Ordered? not applicable Patient meets criteria for HepC screening? No. Ordered?  not applicable  Treat wet prep per standing order Discussed time line for State Lab results and that patient will be called with positive results and encouraged patient to call if she had not heard in 2 weeks.  Counseled to return or seek care for continued or worsening symptoms Recommended repeat testing in 3 months with positive results. Recommended condom use with all sex  Patient is currently using  female condoms and OCP off and on  to prevent pregnancy.    Return if symptoms worsen or fail to improve.  No future appointments.  Edmonia James, NP

## 2023-10-11 NOTE — Progress Notes (Signed)

## 2023-10-16 LAB — GONOCOCCUS CULTURE

## 2024-02-23 ENCOUNTER — Encounter: Payer: Self-pay | Admitting: Family Medicine

## 2024-02-23 ENCOUNTER — Ambulatory Visit: Payer: Medicaid Other

## 2024-02-23 DIAGNOSIS — Z113 Encounter for screening for infections with a predominantly sexual mode of transmission: Secondary | ICD-10-CM

## 2024-02-23 LAB — WET PREP FOR TRICH, YEAST, CLUE
Trichomonas Exam: NEGATIVE
Yeast Exam: NEGATIVE

## 2024-02-23 LAB — HM HIV SCREENING LAB: HM HIV Screening: NEGATIVE

## 2024-02-23 NOTE — Progress Notes (Signed)
 Pt is here for std screening, wet prep results reviewed with pt and no treatment per standing order. Condoms declined. Gaspar Garbe, RN

## 2024-02-23 NOTE — Progress Notes (Signed)
 [facility] Department STI clinic [address] [place] [address] Main phone: [phone]  STI screening visit  Subjective:  Lydia Gallagher is a 28 y.o. female being seen today for an STI screening visit. The patient reports they do not have symptoms.  Patient reports that they do not desire a pregnancy in the next year.   They reported they are not interested in discussing contraception today.    Patient's last menstrual period was 02/19/2024 (approximate).  Patient has the following medical conditions:  Patient Active Problem List   Diagnosis Date Noted   Obesity BMI=34.8 03/03/2022   History of gestational hypertension 12/22/2019   Kidney stone 10/10/19 ER 10/17/2019   Fibroid, uterine 07/05/2019    Chief Complaint  Patient presents with   SEXUALLY TRANSMITTED DISEASE    No symptoms     HPI HPI Patient reports to clinic for STI testing- asymptomatic  Does the patient using douching products? No  Last HIV test per patient/review of record was  Lab Results  Component Value Date   HMHIVSCREEN Negative - Validated 10/11/2023    Lab Results  Component Value Date   HIV Non reactive 10/08/2019     Last HEPC test per patient/review of record was No results found for: "HMHEPCSCREEN" No components found for: "HEPC"   Last HEPB test per patient/review of record was No components found for: "HMHEPBSCREEN"   Patient reports last pap was: 2023  No results found for: "DIAGPAP", "HPVHIGH", "ADEQPAP" Lab Results  Component Value Date   SPECADGYN Comment 03/03/2022   Result Date Procedure Results Follow-ups  03/03/2022 Pap IG (Image Guided) DIAGNOSIS:: Comment Specimen adequacy:: Comment Clinician Provided ICD10: Comment Performed by:: Comment PAP Smear Comment: . Note:: Comment Test Methodology: Comment   06/22/2019 Pap IG (Image Guided) DIAGNOSIS:: Comment Specimen adequacy:: Comment Clinician Provided ICD10: Comment Performed  by:: Comment PAP Smear Comment: . Note:: Comment Test Methodology: Comment     Screening for MPX risk: Does the patient have an unexplained rash? No Is the patient MSM? No Does the patient endorse multiple sex partners or anonymous sex partners? No Did the patient have close or sexual contact with a person diagnosed with MPX? No Has the patient traveled outside the [place] where MPX is endemic? No Is there a high clinical suspicion for MPX-- evidenced by one of the following No  -Unlikely to be chickenpox  -Lymphadenopathy  -Rash that present in same phase of evolution on any given body part See flowsheet for further details and programmatic requirements.   Immunization history:  Immunization History  Administered Date(s) Administered   Hepatitis A 04/09/2008, 11/25/2016   Hepatitis B 18-Oct-1997, 05/20/1997, 09/19/1997   Hpv-Unspecified 11/25/2016   Moderna Sars-Covid-2 Vaccination 10/21/2020   Tdap 10/08/2019     The following portions of the patient's history were reviewed and updated as appropriate: allergies, current medications, past medical history, past social history, past surgical history and problem list.  Objective:  There were no vitals filed for this visit.  Physical Exam Vitals and nursing note reviewed.  Constitutional:      Appearance: Normal appearance.  HENT:     Head: Normocephalic.     Mouth/Throat:     Mouth: Mucous membranes are moist.  Cardiovascular:     Rate and Rhythm: Normal rate.  Pulmonary:     Effort: Pulmonary effort is normal.  Abdominal:     Palpations: Abdomen is soft.  Genitourinary:    Comments: Declined genital exam- no symptoms,  self swabbed Musculoskeletal:        General: Normal range of motion.  Lymphadenopathy:     Head:     Right side of head: No submandibular, preauricular or posterior auricular adenopathy.     Left side of head: No submandibular, preauricular or posterior auricular adenopathy.     Cervical: No cervical  adenopathy.     Upper Body:     Right upper body: No supraclavicular or axillary adenopathy.     Left upper body: No supraclavicular or axillary adenopathy.  Skin:    General: Skin is warm and dry.  Neurological:     Mental Status: She is alert and oriented to person, place, and time.  Psychiatric:        Mood and Affect: Mood normal.     Assessment and Plan:  Lydia Gallagher is a 28 y.o. female presenting to the [facility] Department for STI screening  1. Screening for venereal disease (Primary)  - Chlamydia/Gonorrhea [place] [facility] - HIV [place] [facility] - Syphilis Serology, [place] [facility] - WET PREP FOR TRICH, YEAST, CLUE - Gonococcus culture   Patient accepted all screenings including oral, vaginal CT/GC and bloodwork for HIV/RPR, and wet prep. Patient meets criteria for HepB screening? No. Ordered? not applicable Patient meets criteria for HepC screening? No. Ordered? not applicable  Treat wet prep per standing order Discussed time line for State Lab results and that patient will be called with positive results and encouraged patient to call if she had not heard in 2 weeks.  Counseled to return or seek care for continued or worsening symptoms Recommended repeat testing in 3 months with positive results. Recommended condom use with all sex for STI prevention.   Patient is currently using Hormonal Contraception: Injection, Rings and Patches to prevent pregnancy.    Return if symptoms worsen or fail to improve, for STI screening.  No future appointments.  Lenice Llamas, [place]

## 2024-02-27 LAB — GONOCOCCUS CULTURE

## 2024-07-10 ENCOUNTER — Encounter: Payer: Self-pay | Admitting: Family Medicine

## 2024-07-10 ENCOUNTER — Ambulatory Visit

## 2024-07-10 DIAGNOSIS — Z113 Encounter for screening for infections with a predominantly sexual mode of transmission: Secondary | ICD-10-CM | POA: Diagnosis not present

## 2024-07-10 LAB — WET PREP FOR TRICH, YEAST, CLUE
Clue Cell Exam: POSITIVE — AB
Trichomonas Exam: NEGATIVE
Yeast Exam: NEGATIVE

## 2024-07-10 LAB — HM HIV SCREENING LAB: HM HIV Screening: NEGATIVE

## 2024-07-10 NOTE — Progress Notes (Signed)
 Pt is here for std screening, wet prep results reviewed with pt no treatment required per standing order.  Caren Channel, RN

## 2024-07-10 NOTE — Progress Notes (Signed)
 [facility] Department STI clinic [address] [place] [address] Main phone: [phone]  STI screening visit  Subjective:  Farida DEBRA COLON is a 28 y.o. female being seen today for an STI screening visit. The patient reports they do not have symptoms.  Patient reports that they do not desire a pregnancy in the next year.   They reported they are not interested in discussing contraception today.    Patient's last menstrual period was 06/17/2024 (exact date).  Patient has the following medical conditions:  Patient Active Problem List   Diagnosis Date Noted   Obesity BMI=34.8 03/03/2022   History of gestational hypertension 12/22/2019   Kidney stone 10/10/19 ER 10/17/2019   Fibroid, uterine 07/05/2019   Chief Complaint  Patient presents with   SEXUALLY TRANSMITTED DISEASE    STI screening-no symptoms   HPI Patient reports desire for asymptomatic STI testing. No contacts. 1 female partner in last 2 months.   Does the patient using douching products? No  See flowsheet for further details and programmatic requirements Hyperlink available at the top of the signed note in blue.  Flow sheet content below:  Pregnancy Intention Screening Does the patient want to become pregnant in the next year?: No Does the patient's partner want to become pregnant in the next year?: No Would the patient like to discuss contraceptive options today?: No Reason For STD Screen STD Screening: Is asymptomatic Have you ever had an STD?: Yes History of Antibiotic use in the past 2 weeks?: No STD Symptoms Denies all: Yes Risk Factors for Hep B Household, sexual, or needle sharing contact of a person infected with Hep B: No Sexual contact with a person who uses drugs not as prescribed?: No Currently or Ever used drugs not as prescribed: No HIV Positive: No PRep Patient: No Men who have sex with men: No Have Hepatitis C: No History of Incarceration: No History of  Homeslessness?: No Anal sex following anal drug use?: No Risk Factors for Hep C Currently using drugs not as prescribed: No Sexual partner(s) currently using drugs as not prescribed: No History of drug use: No HIV Positive: No People with a history of incarceration: No People born between the years of 82 and 13: No Abuse History Has patient ever been abused physically?: No Has patient ever been abused sexually?: No Does patient feel they have a problem with Anxiety?: No Does patient feel they have a problem with Depression?: No Counseling Patient counseled to use condoms with all sex: Condoms declined Test results given to patient Patient counseled to use condoms with all sex: Condoms declined   Screening for MPX risk: Does the patient have an unexplained rash? No Is the patient MSM? No Does the patient endorse multiple sex partners or anonymous sex partners? No Did the patient have close or sexual contact with a person diagnosed with MPX? No Has the patient traveled outside the [place]  where MPX is endemic? No Is there a high clinical suspicion for MPX-- evidenced by one of the following No  -Unlikely to be chickenpox  -Lymphadenopathy  -Rash that present in same phase of evolution on any given body part  Screenings: Last HIV test per patient/review of record was  Lab Results  Component Value Date   HMHIVSCREEN Negative - Validated 02/23/2024    Lab Results  Component Value Date   HIV Non reactive 10/08/2019     Last HEPC test per patient/review of record was No results found for: HMHEPCSCREEN No  components found for: HEPC   Last HEPB test per patient/review of record was No components found for: HMHEPBSCREEN   Patient reports last pap was:   Lab Results  Component Value Date   SPECADGYN Comment 03/03/2022   Result Date Procedure Results Follow-ups  03/03/2022 Pap IG (Image Guided) DIAGNOSIS:: Comment Specimen adequacy:: Comment Clinician Provided ICD10:  Comment Performed by:: Comment PAP Smear Comment: . Note:: Comment Test Methodology: Comment   06/22/2019 Pap IG (Image Guided) DIAGNOSIS:: Comment Specimen adequacy:: Comment Clinician Provided ICD10: Comment Performed by:: Comment PAP Smear Comment: . Note:: Comment Test Methodology: Comment     Immunization history:  Immunization History  Administered Date(s) Administered   Hepatitis A 04/09/2008, 11/25/2016   Hepatitis B 05-22-97, 05/20/1997, 09/19/1997   Hpv-Unspecified 11/25/2016   Moderna Sars-Covid-2 Vaccination 10/21/2020   Tdap 10/08/2019    The following portions of the patient's history were reviewed and updated as appropriate: allergies, current medications, past medical history, past social history, past surgical history and problem list.  Objective:  There were no vitals filed for this visit.  Physical Exam Constitutional:      Appearance: Normal appearance.  HENT:     Head: Normocephalic and atraumatic.  Pulmonary:     Effort: Pulmonary effort is normal.  Musculoskeletal:        General: Normal range of motion.  Skin:    General: Skin is warm and dry.  Neurological:     General: No focal deficit present.     Mental Status: She is alert.  Psychiatric:        Mood and Affect: Mood normal.        Behavior: Behavior normal.    Assessment and Plan:  Wendee N Germani is a 29 y.o. female presenting to the [facility] Department for STI screening  1. Screening for venereal disease (Primary)  - WET PREP FOR TRICH, YEAST, CLUE positive for amine and clue, but no symptoms. Will not treat today as Ams[place] criteria not met. - Gonococcus culture - HIV [place] [facility] - Syphilis Serology, [place] [facility] - Chlamydia/Gonorrhea [place] [facility]   Patient accepted the following screenings: oral GC culture, vaginal CT/GC swab, vaginal wet prep, HIV, and RPR Patient meets criteria for HepB screening? No. Ordered? no Patient meets criteria for HepC screening?  No. Ordered? no  Treat wet prep per standing order Discussed time line for State Lab results and that patient will be called with positive results and encouraged patient to call if she had not heard in 2 weeks.  Counseled to return or seek care for continued or worsening symptoms Recommended repeat testing in 3 months with positive results. Recommended condom use with all sex for STI prevention.   Patient is currently using condoms to prevent pregnancy.    Return if symptoms worsen or fail to improve.  No future appointments.  Damien Satchel [facility]  Attestation of Attending Supervision of Advanced Practice Provider (CNM/NP/PA):  Evaluation, management, and procedures were performed by the Advanced Practice Provider under my supervision and collaboration.  I have reviewed the Advanced Practice Provider's note and chart, and I agree with the management and plan.  Dorothyann Helling, MD Clinical Services Medical Director [facility] Department 07/10/24  12:27 PM

## 2024-07-11 LAB — CHLAMYDIA/GONORRHEA  ~~LOC~~ LAB
Chlamydia by NAA: POSITIVE
N. Gonorrheae RNA, TMA: NEGATIVE

## 2024-07-15 LAB — GONOCOCCUS CULTURE

## 2024-07-19 ENCOUNTER — Telehealth: Payer: Self-pay

## 2024-07-19 NOTE — Telephone Encounter (Signed)
 Call pt re positive chlamydia result from 07/10/24 vaginal specimen.  Needs tx.

## 2024-07-20 ENCOUNTER — Encounter: Payer: Self-pay | Admitting: Family Medicine

## 2024-07-20 ENCOUNTER — Ambulatory Visit

## 2024-07-20 ENCOUNTER — Ambulatory Visit: Payer: Self-pay

## 2024-07-20 DIAGNOSIS — Z113 Encounter for screening for infections with a predominantly sexual mode of transmission: Secondary | ICD-10-CM

## 2024-07-20 DIAGNOSIS — A749 Chlamydial infection, unspecified: Secondary | ICD-10-CM

## 2024-07-20 MED ORDER — AZITHROMYCIN 500 MG PO TABS
1000.0000 mg | ORAL_TABLET | Freq: Once | ORAL | Status: AC
  Administered 2024-07-20: 1000 mg via ORAL

## 2024-07-20 NOTE — Telephone Encounter (Signed)
 Phone call to pt at [phone]. Pt answered and confirmed password. Counseled pt re + CT result. Pt requests tx appt ASAP.  Tx appt scheduled for 07/20/24 PM. RN clinic notified of appt added this PM.

## 2024-07-20 NOTE — Progress Notes (Signed)
 In nurse clinic for chlamydia tx.  LMP 06/17/2024. No birth control method. Reports unprotected sex approx 2 weeks ago. NKA.  States she has cloudy white discharge with foul odor that started 2 days ago. Wants to know if she needs to be treated for BV today as well. Patient explains she was told she had signs of BV at her appt 07/10/2024, but was not tx because she did not have symptoms.   Consult Dr JAYSON Helling who advises patient to be treated today for chlamydia only. Possible that patient symptoms are from the chlamydia. Advises RN to counsel patient that if symptoms do not decline in about one week after chlamydia treatment, she may schedule a STI appt to be evaluated.   RN discussed provider recommendations and she is in agreement. Questions answered and reports understanding.   Treated today for chlamydia per [facility] Dr JAYSON Helling with Azithromycin  1 gram by mouth DOT once. Advised to eat after appt today and to notify [facility] if vomits within 2 hrs of taking med today. Azithromycin  info sheet given. Jaxon Flatt, RN

## 2024-08-10 ENCOUNTER — Ambulatory Visit: Admitting: Dietician

## 2024-08-16 ENCOUNTER — Ambulatory Visit

## 2024-08-16 DIAGNOSIS — A749 Chlamydial infection, unspecified: Secondary | ICD-10-CM

## 2024-08-16 DIAGNOSIS — Z113 Encounter for screening for infections with a predominantly sexual mode of transmission: Secondary | ICD-10-CM | POA: Diagnosis not present

## 2024-08-16 LAB — WET PREP FOR TRICH, YEAST, CLUE
Clue Cell Exam: NEGATIVE
Trichomonas Exam: NEGATIVE
Yeast Exam: NEGATIVE

## 2024-08-16 MED ORDER — DOXYCYCLINE HYCLATE 100 MG PO TABS: 100.0000 mg | ORAL_TABLET | Freq: Two times a day (BID) | ORAL | Status: AC

## 2024-08-16 NOTE — Progress Notes (Signed)
 [facility] Problem Visit  [facility]  Subjective:  Lydia Gallagher is a 28 y.o. being seen today for follow up treatment for chlamydia.   Chief Complaint  Patient presents with   SEXUALLY TRANSMITTED DISEASE   HPI Patient reports she vomited the Azithromycin  that was given for chlamydia treatment. Vomited 1 hour or less after taking the medication on 7/25. No other reaction to the medication other than vomiting. She had taken the medication on an empty stomach.  Today her symptoms include discharge (cloudy, white), odor (difficult to describe). No burning, itching, pelvic pain.   She is worried she was not effectively treated for her chlamydia infection.  Health Maintenance Due  Topic Date Due   Hepatitis C Screening  Never done   HPV VACCINES (2 - 3-dose series) 12/23/2016   COVID-19 Vaccine (2 - 2024-25 season) 08/28/2023   INFLUENZA VACCINE  07/27/2024   The following portions of the patient's history were reviewed and updated as appropriate: allergies, current medications, past family history, past medical history, past social history, past surgical history and problem list. Problem list updated.   See flowsheet for other program required questions.  Objective:  There were no vitals filed for this visit.  Physical Exam Vitals and nursing note reviewed. Exam conducted with a chaperone present (Chaperone declined by patient).  Constitutional:      Appearance: Normal appearance.  HENT:     Head: Normocephalic and atraumatic.     Mouth/Throat:     Mouth: Mucous membranes are moist.     Pharynx: Oropharynx is clear. No oropharyngeal exudate or posterior oropharyngeal erythema.  Pulmonary:     Effort: Pulmonary effort is normal.  Abdominal:     General: Abdomen is flat.  Genitourinary:    General: Normal vulva.     Exam position: Lithotomy position.     Pubic Area: No rash or pubic lice.      Labia:        Right: No rash or  lesion.        Left: No rash or lesion.      Vagina: Normal. No vaginal discharge, erythema, bleeding or lesions.     Cervix: No discharge, friability, lesion or erythema.     Comments: pH = 5 Light vaginal bleeding (menstruating) Lymphadenopathy:     Head:     Right side of head: No preauricular or posterior auricular adenopathy.     Left side of head: No preauricular or posterior auricular adenopathy.     Cervical: No cervical adenopathy.     Upper Body:     Right upper body: No supraclavicular, axillary or epitrochlear adenopathy.     Left upper body: No supraclavicular, axillary or epitrochlear adenopathy.  Skin:    General: Skin is warm and dry.     Findings: No rash.  Neurological:     Mental Status: She is alert and oriented to person, place, and time.    Assessment and Plan:  Lydia Gallagher is a 28 y.o. female presenting to the [facility] Department for a Women's Health problem visit  1. Screening for venereal disease (Primary)  - Chlamydia/Gonorrhea [facility] re-testing today, has been almost 4 weeks since treatment attempt  - WET PREP FOR TRICH, YEAST, CLUE negative today  2. Chlamydia infection  - Will treat today as she vomited the azithromycin   - She is not pregnant (currently menstruating) and has no allergies - doxycycline  (VIBRA -TABS) 100 MG tablet;  Take 1 tablet (100 mg total) by mouth 2 (two) times daily for 7 days.   Return in about 3 months (around 11/16/2024), or for STI testing.  Future Appointments  Date Time Provider Department Center  08/23/2024  8:00 AM Katheleen Aleene ORN, RD [facility] None    Damien FORBES Satchel, NP

## 2024-08-16 NOTE — Progress Notes (Signed)
 The patient was dispensed #14 doxycycline  today. I provided counseling today regarding the medication. We discussed the medication, the side effects and when to call clinic. Patient given the opportunity to ask questions. Questions answered.   Wet prep reviewed with pt, no treatment needed per so. Larraine JONELLE Northern, RN

## 2024-08-23 ENCOUNTER — Ambulatory Visit: Payer: Self-pay | Admitting: Dietician

## 2024-12-13 ENCOUNTER — Ambulatory Visit

## 2024-12-13 DIAGNOSIS — Z113 Encounter for screening for infections with a predominantly sexual mode of transmission: Secondary | ICD-10-CM

## 2024-12-13 LAB — HM HIV SCREENING LAB: HM HIV Screening: NEGATIVE

## 2024-12-13 LAB — WET PREP FOR TRICH, YEAST, CLUE
Clue Cell Exam: NEGATIVE
Trichomonas Exam: NEGATIVE
Yeast Exam: NEGATIVE

## 2024-12-13 NOTE — Progress Notes (Signed)
 Pt is here for STD screening. Wet prep results reviewed with patient and requires no treatment per SO. Opportunity given to patient to ask questions for any clarifications. Questions answered.  Condoms declined. Kwadwo Tonishia Steffy,RN.

## 2024-12-13 NOTE — Progress Notes (Signed)
 [facility] [address], [address] [place] [address] Main phone: [phone]  STI screening visit  Subjective:  Lydia Gallagher is a 28 y.o. female being seen today for an STI screening visit. The patient reports they do not have symptoms.    Patient has the following medical conditions:  Patient Active Problem List   Diagnosis Date Noted   Obesity BMI=34.8 03/03/2022   History of gestational hypertension 12/22/2019   Kidney stone 10/10/19 ER 10/17/2019   Fibroid, uterine 07/05/2019   Chief Complaint  Patient presents with   SEXUALLY TRANSMITTED DISEASE    HPI Patient reports here for routine STI screening, does not have any symptoms.   Reproductive considerations Patient reports they are not pregnant . They do not desire a pregnancy in the next year. Patient is currently using no method - no contraceptive precautions to prevent pregnancy. They reported they are not interested in discussing contraception today.    Patient's last menstrual period was 12/10/2024 (exact date).  Does the patient using douching products? No  Patient's routine cervical screening is unknown.  See flowsheet for further details and programmatic requirements Hyperlink available at the top of the signed note in blue.  Flow sheet content below:  Pregnancy Intention Screening Does the patient want to become pregnant in the next year?: No Does the patient's partner want to become pregnant in the next year?: N/A Would the patient like to discuss contraceptive options today?: No Counseling Patient counseled to use condoms with all sex: Condoms declined RTC in 2-3 weeks for test results: Yes Clinic will call if test results abnormal before test result appt.: Yes Test results given to patient Patient counseled to use condoms with all sex: Condoms declined   Screening for MPX risk:  Unexplained rash?  No   MSM?  No   Multiple or anonymous sex  partners?  No   Any close or sexual contact with a person  diagnosed with MPX?  No   Any outside the [place]  where MPX is endemic?  No   High clinical suspicion for MPX?    -Unlikely to be chickenpox    -Lymphadenopathy    -Rash that presents in same phase of       evolution on any given body part  No   Does this patient meet CDC recommendations for vaccination against MPOX? No  You already have or anticipate having the following risks:  Your sex partner has the following risks: You're traveling to a county with a clade I MPOX outbreak and anticipate these risks: Occupational exposure  You had known or suspected exposure to someone with monkeypox You had a sex partner in the past 2 weeks who was diagnosed with monkeypox You are a gay, bisexual, or other man who has sex with men, or are transgender or nonbinary and in the past 6 months have had any of the following: - A new diagnosis of one or more sexually transmitted diseases (e.g., chlamydia, gonorrhea, or syphilis) - More than one sex partner You have had any of the following in the past 6 months: - Sex at a commercial sex venue (like a sex club or bathhouse) - Sex related to a large commercial event   or in a geographic area (city or county for example) where mpox virus transmission is occurring Sex with a new partner Sex at a commercial sex venue (e.g., a sex club or bathhouse) Sex in exchange for money, goods, drugs, or other  trade Sex in association with a large public event (e.g., a rave, party, or festival) i.e. certain people who work in a tax adviser or healthcare facility   Infectious disease screenings: Vaccinated against HPV? Yes  HIV Ever had a positive? No Last test: Unknown Results in chart:  Lab Results  Component Value Date   HMHIVSCREEN Negative - Validated 07/10/2024    Lab Results  Component Value Date   HIV Non reactive 10/08/2019     Hep B Hep B status: unknown or no prior testing Received HBV vaccination?  Yes Received HBV testing for immunity? Unknown Results in chart:  No components found for: HMHEPBSCREEN  Do they qualify for HBV screening today? No Criteria:  -Household, sexual or needle sharing contact with HBV -History of drug use or homelessness -HIV positive -Those with known Hep C  Hep C Hep C status: unknown or no prior testing Results in chart:  No results found for: HMHEPCSCREEN No components found for: HEPC  Do they qualify for HCV screening today? No Criteria - since the last HCV result, does the patient have any of the following? - Current drug use - Have a partner with drug use - Has been incarcerated  Immunization history:  Immunization History  Administered Date(s) Administered   Hepatitis A 04/09/2008, 11/25/2016   Hepatitis B 10-16-1997, 05/20/1997, 09/19/1997   Hpv-Unspecified 11/25/2016   Moderna Sars-Covid-2 Vaccination 10/21/2020   Tdap 10/08/2019    The following portions of the patient's history were reviewed and updated as appropriate: allergies, current medications, past medical history, past social history, past surgical history and problem list.  Substance use screenings:  Uses tobacco products? No Uses vapes? No Uses alcohol? No Uses non-injectable substances that alter your mental status? No Uses non-prescribed injectable substances? No  Objective:  There were no vitals filed for this visit.  Physical Exam Vitals and nursing note reviewed.  Constitutional:      Appearance: Normal appearance.  HENT:     Head: Normocephalic and atraumatic.     Comments: No nits or hair loss on scalp, brows, and lashes    Nose: Nose normal.     Mouth/Throat:     Mouth: Mucous membranes are moist.     Pharynx: Oropharynx is clear. No oropharyngeal exudate or posterior oropharyngeal erythema.  Eyes:     Conjunctiva/sclera: Conjunctivae normal.     Right eye: Right conjunctiva is not injected.     Left eye: Left conjunctiva is not injected.      Pupils: Pupils are equal, round, and reactive to light.  Pulmonary:     Effort: Pulmonary effort is normal.  Abdominal:     General: Abdomen is flat.  Genitourinary:    Comments: Politely declined genital exam, pt opt'd to self-swab Musculoskeletal:     Cervical back: Normal range of motion.  Lymphadenopathy:     Cervical: No cervical adenopathy.     Upper Body:     Right upper body: No supraclavicular, axillary or epitrochlear adenopathy.     Left upper body: No supraclavicular, axillary or epitrochlear adenopathy.     Comments: Patient declines pelvic exam, inguinal lymph nodes not evaluated.  Skin:    General: Skin is warm and dry.     Capillary Refill: Capillary refill takes less than 2 seconds.     Findings: No lesion or rash.  Neurological:     General: No focal deficit present.     Mental Status: She is alert and oriented to person, place, and time.  Mental status is at baseline.  Psychiatric:        Mood and Affect: Mood normal.        Behavior: Behavior normal.        Thought Content: Thought content normal.     Assessment and Plan:  Lydia Gallagher is a 28 y.o. female presenting to the [facility] Department for STI screening.  Patient accepted the following screenings: oral GC culture, vaginal CT/GC swab, vaginal wet prep, HIV, and RPR  1. Screening for venereal disease (Primary) -Routine STI screening conducted. -Wet Prep: Negative  - Syphilis Serology, [facility] - Chlamydia/Gonorrhea [facility] - HIV  LAB - WET PREP FOR TRICH, YEAST, CLUE - Gonococcus culture   Counseling: Discussed time line for [facility] results and that patient will be called with positive results and encouraged patient to call if they had not heard in 2 weeks.  Counseled to return or seek care for continued or worsening symptoms Recommended repeat testing in 3 months with positive results. Recommended condom use with all sex for STI prevention.   Return for  STI screening, PRN.  No future appointments.  Hardin GORMAN Pouch, NP  Attestation of Supervision of Advanced Practitioner (CNM/PA/NP): Evaluation and management procedures were performed by the Advanced Practice Provider under my supervision and collaboration.  I have reviewed the Advanced Practice Provider's note and chart, and I agree with the management and plan. I have also made any necessary editorial changes.   I was working along side this practitioner all day and all medical plans were discussed with me.   Verneta Bers, [place]

## 2024-12-17 LAB — GONOCOCCUS CULTURE
